# Patient Record
Sex: Female | Born: 1940 | Race: White | Hispanic: No | Marital: Single | Smoking: Former smoker
Health system: Southern US, Community
[De-identification: ages and names within clinical notes are randomized; demographics above are authoritative.]

## PROBLEM LIST (undated history)

## (undated) DIAGNOSIS — M1990 Unspecified osteoarthritis, unspecified site: Secondary | ICD-10-CM

## (undated) DIAGNOSIS — E039 Hypothyroidism, unspecified: Secondary | ICD-10-CM

## (undated) DIAGNOSIS — M8580 Other specified disorders of bone density and structure, unspecified site: Secondary | ICD-10-CM

## (undated) DIAGNOSIS — I341 Nonrheumatic mitral (valve) prolapse: Secondary | ICD-10-CM

## (undated) DIAGNOSIS — T7840XA Allergy, unspecified, initial encounter: Secondary | ICD-10-CM

## (undated) HISTORY — DX: Hypothyroidism, unspecified: E03.9

## (undated) HISTORY — DX: Nonrheumatic mitral (valve) prolapse: I34.1

## (undated) HISTORY — PX: ABDOMINAL HYSTERECTOMY: SHX81

## (undated) HISTORY — DX: Allergy, unspecified, initial encounter: T78.40XA

## (undated) HISTORY — DX: Unspecified osteoarthritis, unspecified site: M19.90

## (undated) HISTORY — PX: DILATION AND CURETTAGE OF UTERUS: SHX78

## (undated) HISTORY — PX: THYROIDECTOMY: SHX17

## (undated) HISTORY — PX: TONSILLECTOMY AND ADENOIDECTOMY: SUR1326

## (undated) HISTORY — PX: TUBAL LIGATION: SHX77

## (undated) HISTORY — PX: LEEP: SHX91

## (undated) HISTORY — DX: Other specified disorders of bone density and structure, unspecified site: M85.80

---

## 2001-01-09 ENCOUNTER — Encounter: Payer: Self-pay | Admitting: Internal Medicine

## 2001-01-09 LAB — CONVERTED CEMR LAB

## 2002-07-10 HISTORY — PX: KNEE CARTILAGE SURGERY: SHX688

## 2002-08-17 ENCOUNTER — Encounter: Payer: Self-pay | Admitting: Emergency Medicine

## 2002-08-17 ENCOUNTER — Emergency Department (HOSPITAL_COMMUNITY): Admission: EM | Admit: 2002-08-17 | Discharge: 2002-08-17 | Payer: Self-pay | Admitting: *Deleted

## 2003-01-07 ENCOUNTER — Encounter: Payer: Self-pay | Admitting: Internal Medicine

## 2004-04-18 ENCOUNTER — Ambulatory Visit: Payer: Self-pay | Admitting: Internal Medicine

## 2004-04-26 ENCOUNTER — Ambulatory Visit: Payer: Self-pay | Admitting: Internal Medicine

## 2004-04-26 ENCOUNTER — Other Ambulatory Visit: Admission: RE | Admit: 2004-04-26 | Discharge: 2004-04-26 | Payer: Self-pay | Admitting: Internal Medicine

## 2004-10-12 ENCOUNTER — Ambulatory Visit (HOSPITAL_COMMUNITY): Admission: RE | Admit: 2004-10-12 | Discharge: 2004-10-12 | Payer: Self-pay | Admitting: Internal Medicine

## 2005-06-07 ENCOUNTER — Ambulatory Visit: Payer: Self-pay | Admitting: Internal Medicine

## 2005-06-09 ENCOUNTER — Ambulatory Visit: Payer: Self-pay | Admitting: Internal Medicine

## 2005-07-21 ENCOUNTER — Ambulatory Visit: Payer: Self-pay | Admitting: Gastroenterology

## 2005-08-04 ENCOUNTER — Ambulatory Visit: Payer: Self-pay | Admitting: Internal Medicine

## 2006-03-23 ENCOUNTER — Ambulatory Visit (HOSPITAL_COMMUNITY): Admission: RE | Admit: 2006-03-23 | Discharge: 2006-03-23 | Payer: Self-pay | Admitting: Internal Medicine

## 2006-03-23 ENCOUNTER — Encounter: Payer: Self-pay | Admitting: Internal Medicine

## 2006-05-04 ENCOUNTER — Ambulatory Visit: Payer: Self-pay | Admitting: Internal Medicine

## 2006-06-15 ENCOUNTER — Ambulatory Visit: Payer: Self-pay | Admitting: Internal Medicine

## 2006-06-15 LAB — CONVERTED CEMR LAB
ALT: 16 units/L (ref 0–40)
AST: 20 units/L (ref 0–37)
Albumin: 3.8 g/dL (ref 3.5–5.2)
Alkaline Phosphatase: 54 units/L (ref 39–117)
BUN: 9 mg/dL (ref 6–23)
Basophils Absolute: 0 10*3/uL (ref 0.0–0.1)
Basophils Relative: 0.2 % (ref 0.0–1.0)
Bilirubin, Direct: 0.1 mg/dL (ref 0.0–0.3)
CO2: 29 meq/L (ref 19–32)
Calcium, Total (PTH): 9.3 mg/dL (ref 8.4–10.5)
Calcium: 9.4 mg/dL (ref 8.4–10.5)
Chloride: 108 meq/L (ref 96–112)
Cholesterol: 222 mg/dL (ref 0–200)
Creatinine, Ser: 0.9 mg/dL (ref 0.4–1.2)
Direct LDL: 133.2 mg/dL
Eosinophils Absolute: 0.2 10*3/uL (ref 0.0–0.6)
Eosinophils Relative: 5.6 % — ABNORMAL HIGH (ref 0.0–5.0)
GFR calc Af Amer: 81 mL/min
GFR calc non Af Amer: 67 mL/min
Glucose, Bld: 95 mg/dL (ref 70–99)
HCT: 40.9 % (ref 36.0–46.0)
HDL: 62.8 mg/dL (ref >39.0)
Hemoglobin: 14 g/dL (ref 12.0–15.0)
Lymphocytes Relative: 51 % — ABNORMAL HIGH (ref 12.0–46.0)
MCHC: 34.3 g/dL (ref 30.0–36.0)
MCV: 96.1 fL (ref 78.0–100.0)
Monocytes Absolute: 0.3 10*3/uL (ref 0.2–0.7)
Monocytes Relative: 8.2 % (ref 3.0–11.0)
Neutro Abs: 1.3 10*3/uL — ABNORMAL LOW (ref 1.4–7.7)
Neutrophils Relative %: 35 % — ABNORMAL LOW (ref 43.0–77.0)
PTH: 43.6 pg/mL (ref 14.0–72.0)
Platelets: 231 10*3/uL (ref 150–400)
Potassium: 4.1 meq/L (ref 3.5–5.1)
RBC: 4.26 M/uL (ref 3.87–5.11)
RDW: 12.9 % (ref 11.5–14.6)
Sodium: 143 meq/L (ref 135–145)
TSH: 6.24 microintl units/mL — ABNORMAL HIGH (ref 0.35–5.50)
Total Bilirubin: 1.2 mg/dL (ref 0.3–1.2)
Total CHOL/HDL Ratio: 3.5
Total Protein: 6.7 g/dL (ref 6.0–8.3)
Triglycerides: 79 mg/dL (ref 0–149)
VLDL: 16 mg/dL (ref 0–40)
Vit D, 1,25-Dihydroxy: 32 (ref 20–57)
WBC: 3.6 10*3/uL — ABNORMAL LOW (ref 4.5–10.5)

## 2006-06-22 ENCOUNTER — Ambulatory Visit: Payer: Self-pay | Admitting: Internal Medicine

## 2006-08-13 ENCOUNTER — Encounter: Payer: Self-pay | Admitting: Internal Medicine

## 2006-08-13 DIAGNOSIS — M949 Disorder of cartilage, unspecified: Secondary | ICD-10-CM

## 2006-08-13 DIAGNOSIS — E039 Hypothyroidism, unspecified: Secondary | ICD-10-CM | POA: Insufficient documentation

## 2006-08-13 DIAGNOSIS — M899 Disorder of bone, unspecified: Secondary | ICD-10-CM | POA: Insufficient documentation

## 2006-08-24 ENCOUNTER — Ambulatory Visit: Payer: Self-pay | Admitting: Internal Medicine

## 2006-08-24 LAB — CONVERTED CEMR LAB
Anti Nuclear Antibody(ANA): NEGATIVE
Basophils Absolute: 0 10*3/uL (ref 0.0–0.1)
Basophils Relative: 0.3 % (ref 0.0–1.0)
Eosinophils Absolute: 0.2 10*3/uL (ref 0.0–0.6)
Eosinophils Relative: 4 % (ref 0.0–5.0)
HCT: 40.4 % (ref 36.0–46.0)
Hemoglobin: 13.9 g/dL (ref 12.0–15.0)
Lymphocytes Relative: 44.1 % (ref 12.0–46.0)
MCHC: 34.5 g/dL (ref 30.0–36.0)
MCV: 96.3 fL (ref 78.0–100.0)
Monocytes Absolute: 0.4 10*3/uL (ref 0.2–0.7)
Monocytes Relative: 9.2 % (ref 3.0–11.0)
Neutro Abs: 1.7 10*3/uL (ref 1.4–7.7)
Neutrophils Relative %: 42.4 % — ABNORMAL LOW (ref 43.0–77.0)
Platelets: 254 10*3/uL (ref 150–400)
RBC: 4.19 M/uL (ref 3.87–5.11)
RDW: 12.4 % (ref 11.5–14.6)
TSH: 0.05 microintl units/mL — ABNORMAL LOW (ref 0.35–5.50)
Vit D, 1,25-Dihydroxy: 26 (ref 20–57)
Vitamin B-12: 440 pg/mL (ref 211–911)
WBC: 4 10*3/uL — ABNORMAL LOW (ref 4.5–10.5)

## 2006-08-31 ENCOUNTER — Ambulatory Visit: Payer: Self-pay | Admitting: Internal Medicine

## 2006-08-31 DIAGNOSIS — D709 Neutropenia, unspecified: Secondary | ICD-10-CM | POA: Insufficient documentation

## 2006-08-31 DIAGNOSIS — R209 Unspecified disturbances of skin sensation: Secondary | ICD-10-CM | POA: Insufficient documentation

## 2006-09-03 ENCOUNTER — Telehealth: Payer: Self-pay | Admitting: Internal Medicine

## 2006-09-11 ENCOUNTER — Encounter: Payer: Self-pay | Admitting: Internal Medicine

## 2006-11-30 ENCOUNTER — Ambulatory Visit: Payer: Self-pay | Admitting: Internal Medicine

## 2006-12-03 LAB — CONVERTED CEMR LAB: TSH: 0.05 microintl units/mL — ABNORMAL LOW (ref 0.35–5.50)

## 2006-12-05 ENCOUNTER — Telehealth: Payer: Self-pay | Admitting: *Deleted

## 2007-01-23 ENCOUNTER — Encounter: Payer: Self-pay | Admitting: Internal Medicine

## 2007-02-08 ENCOUNTER — Ambulatory Visit: Payer: Self-pay | Admitting: Internal Medicine

## 2007-02-08 LAB — CONVERTED CEMR LAB
Bilirubin Urine: NEGATIVE
Glucose, Urine, Semiquant: NEGATIVE
Ketones, urine, test strip: NEGATIVE
Nitrite: NEGATIVE
Protein, U semiquant: NEGATIVE
Specific Gravity, Urine: 1.015
Urobilinogen, UA: 0.2
WBC Urine, dipstick: NEGATIVE
pH: 7

## 2007-02-10 LAB — CONVERTED CEMR LAB
ALT: 23 units/L (ref 0–35)
AST: 19 units/L (ref 0–37)
Albumin: 3.9 g/dL (ref 3.5–5.2)
Alkaline Phosphatase: 63 units/L (ref 39–117)
BUN: 12 mg/dL (ref 6–23)
Basophils Absolute: 0 10*3/uL (ref 0.0–0.1)
Basophils Relative: 0.9 % (ref 0.0–1.0)
Bilirubin, Direct: 0.2 mg/dL (ref 0.0–0.3)
CO2: 31 meq/L (ref 19–32)
Calcium: 9.6 mg/dL (ref 8.4–10.5)
Chloride: 106 meq/L (ref 96–112)
Cholesterol: 207 mg/dL (ref 0–200)
Creatinine, Ser: 0.9 mg/dL (ref 0.4–1.2)
Direct LDL: 126.8 mg/dL
Eosinophils Absolute: 0.2 10*3/uL (ref 0.0–0.6)
Eosinophils Relative: 5 % (ref 0.0–5.0)
GFR calc Af Amer: 81 mL/min
GFR calc non Af Amer: 67 mL/min
Glucose, Bld: 94 mg/dL (ref 70–99)
HCT: 42 % (ref 36.0–46.0)
HDL: 61.2 mg/dL (ref >39.0)
Hemoglobin: 14 g/dL (ref 12.0–15.0)
Lymphocytes Relative: 48.5 % — ABNORMAL HIGH (ref 12.0–46.0)
MCHC: 33.2 g/dL (ref 30.0–36.0)
MCV: 95.6 fL (ref 78.0–100.0)
Monocytes Absolute: 0.4 10*3/uL (ref 0.2–0.7)
Monocytes Relative: 8.6 % (ref 3.0–11.0)
Neutro Abs: 1.6 10*3/uL (ref 1.4–7.7)
Neutrophils Relative %: 37 % — ABNORMAL LOW (ref 43.0–77.0)
Platelets: 236 10*3/uL (ref 150–400)
Potassium: 4.4 meq/L (ref 3.5–5.1)
RBC: 4.39 M/uL (ref 3.87–5.11)
RDW: 12.5 % (ref 11.5–14.6)
Sodium: 143 meq/L (ref 135–145)
TSH: 0.09 microintl units/mL — ABNORMAL LOW (ref 0.35–5.50)
Total Bilirubin: 0.9 mg/dL (ref 0.3–1.2)
Total CHOL/HDL Ratio: 3.4
Total Protein: 6.3 g/dL (ref 6.0–8.3)
Triglycerides: 51 mg/dL (ref 0–149)
VLDL: 10 mg/dL (ref 0–40)
WBC: 4.2 10*3/uL — ABNORMAL LOW (ref 4.5–10.5)

## 2007-02-15 ENCOUNTER — Ambulatory Visit: Payer: Self-pay | Admitting: Internal Medicine

## 2007-02-15 DIAGNOSIS — J309 Allergic rhinitis, unspecified: Secondary | ICD-10-CM | POA: Insufficient documentation

## 2007-02-27 ENCOUNTER — Ambulatory Visit: Payer: Self-pay | Admitting: Internal Medicine

## 2007-05-15 ENCOUNTER — Ambulatory Visit: Payer: Self-pay | Admitting: Internal Medicine

## 2007-05-22 LAB — CONVERTED CEMR LAB: TSH: 0.09 microintl units/mL — ABNORMAL LOW (ref 0.35–5.50)

## 2007-06-07 ENCOUNTER — Ambulatory Visit: Payer: Self-pay | Admitting: Internal Medicine

## 2007-06-07 DIAGNOSIS — L255 Unspecified contact dermatitis due to plants, except food: Secondary | ICD-10-CM | POA: Insufficient documentation

## 2007-06-07 DIAGNOSIS — N814 Uterovaginal prolapse, unspecified: Secondary | ICD-10-CM | POA: Insufficient documentation

## 2007-07-10 ENCOUNTER — Other Ambulatory Visit: Admission: RE | Admit: 2007-07-10 | Discharge: 2007-07-10 | Payer: Self-pay | Admitting: Obstetrics & Gynecology

## 2007-08-16 ENCOUNTER — Ambulatory Visit: Payer: Self-pay | Admitting: Internal Medicine

## 2007-08-26 ENCOUNTER — Telehealth: Payer: Self-pay | Admitting: *Deleted

## 2007-08-26 LAB — CONVERTED CEMR LAB: TSH: 0.1 microintl units/mL — ABNORMAL LOW (ref 0.35–5.50)

## 2007-09-02 ENCOUNTER — Encounter: Payer: Self-pay | Admitting: Obstetrics & Gynecology

## 2007-09-02 ENCOUNTER — Encounter: Payer: Self-pay | Admitting: Internal Medicine

## 2007-09-02 ENCOUNTER — Ambulatory Visit (HOSPITAL_COMMUNITY): Admission: RE | Admit: 2007-09-02 | Discharge: 2007-09-03 | Payer: Self-pay | Admitting: Obstetrics & Gynecology

## 2007-09-03 ENCOUNTER — Encounter: Payer: Self-pay | Admitting: Internal Medicine

## 2007-09-30 ENCOUNTER — Encounter: Payer: Self-pay | Admitting: Internal Medicine

## 2007-10-08 ENCOUNTER — Encounter: Payer: Self-pay | Admitting: Internal Medicine

## 2007-11-06 ENCOUNTER — Ambulatory Visit: Payer: Self-pay | Admitting: Internal Medicine

## 2007-11-12 LAB — CONVERTED CEMR LAB: TSH: 0.1 microintl units/mL — ABNORMAL LOW (ref 0.35–5.50)

## 2008-02-13 ENCOUNTER — Ambulatory Visit: Payer: Self-pay | Admitting: Internal Medicine

## 2008-02-20 LAB — CONVERTED CEMR LAB: TSH: 1.34 microintl units/mL (ref 0.35–5.50)

## 2008-04-29 ENCOUNTER — Ambulatory Visit: Payer: Self-pay | Admitting: Internal Medicine

## 2008-04-29 DIAGNOSIS — Z8679 Personal history of other diseases of the circulatory system: Secondary | ICD-10-CM | POA: Insufficient documentation

## 2008-04-29 DIAGNOSIS — R9431 Abnormal electrocardiogram [ECG] [EKG]: Secondary | ICD-10-CM | POA: Insufficient documentation

## 2008-05-25 ENCOUNTER — Telehealth: Payer: Self-pay | Admitting: *Deleted

## 2009-02-11 ENCOUNTER — Telehealth: Payer: Self-pay | Admitting: *Deleted

## 2009-05-05 ENCOUNTER — Telehealth: Payer: Self-pay | Admitting: *Deleted

## 2009-06-04 ENCOUNTER — Ambulatory Visit: Payer: Self-pay | Admitting: Internal Medicine

## 2009-06-04 LAB — CONVERTED CEMR LAB
ALT: 17 units/L (ref 0–35)
AST: 18 units/L (ref 0–37)
Albumin: 3.8 g/dL (ref 3.5–5.2)
Alkaline Phosphatase: 52 units/L (ref 39–117)
BUN: 14 mg/dL (ref 6–23)
Basophils Absolute: 0 10*3/uL (ref 0.0–0.1)
Basophils Relative: 1.1 % (ref 0.0–3.0)
Bilirubin Urine: NEGATIVE
Bilirubin, Direct: 0.2 mg/dL (ref 0.0–0.3)
Blood in Urine, dipstick: NEGATIVE
CO2: 32 meq/L (ref 19–32)
Calcium: 9.2 mg/dL (ref 8.4–10.5)
Chloride: 110 meq/L (ref 96–112)
Cholesterol: 192 mg/dL (ref 0–200)
Creatinine, Ser: 0.8 mg/dL (ref 0.4–1.2)
Eosinophils Absolute: 0.2 10*3/uL (ref 0.0–0.7)
Eosinophils Relative: 4.5 % (ref 0.0–5.0)
GFR calc non Af Amer: 72.93 mL/min (ref >60)
Glucose, Bld: 90 mg/dL (ref 70–99)
Glucose, Urine, Semiquant: NEGATIVE
HCT: 40.4 % (ref 36.0–46.0)
HDL: 54.6 mg/dL (ref >39.00)
Hemoglobin: 13.9 g/dL (ref 12.0–15.0)
Ketones, urine, test strip: NEGATIVE
LDL Cholesterol: 129 mg/dL — ABNORMAL HIGH (ref 0–99)
Lymphocytes Relative: 52.2 % — ABNORMAL HIGH (ref 12.0–46.0)
Lymphs Abs: 2 10*3/uL (ref 0.7–4.0)
MCHC: 34.3 g/dL (ref 30.0–36.0)
MCV: 96.7 fL (ref 78.0–100.0)
Monocytes Absolute: 0.3 10*3/uL (ref 0.1–1.0)
Monocytes Relative: 7.8 % (ref 3.0–12.0)
Neutro Abs: 1.3 10*3/uL — ABNORMAL LOW (ref 1.4–7.7)
Neutrophils Relative %: 34.4 % — ABNORMAL LOW (ref 43.0–77.0)
Nitrite: NEGATIVE
Platelets: 207 10*3/uL (ref 150.0–400.0)
Potassium: 5.1 meq/L (ref 3.5–5.1)
RBC: 4.18 M/uL (ref 3.87–5.11)
RDW: 13.5 % (ref 11.5–14.6)
Sodium: 147 meq/L — ABNORMAL HIGH (ref 135–145)
Specific Gravity, Urine: 1.015
TSH: 1.93 microintl units/mL (ref 0.35–5.50)
Total Bilirubin: 0.7 mg/dL (ref 0.3–1.2)
Total CHOL/HDL Ratio: 4
Total Protein: 6.2 g/dL (ref 6.0–8.3)
Triglycerides: 44 mg/dL (ref 0.0–149.0)
Urobilinogen, UA: 0.2
VLDL: 8.8 mg/dL (ref 0.0–40.0)
WBC: 3.8 10*3/uL — ABNORMAL LOW (ref 4.5–10.5)
pH: 8.5

## 2009-06-11 ENCOUNTER — Ambulatory Visit: Payer: Self-pay | Admitting: Internal Medicine

## 2010-01-30 ENCOUNTER — Encounter: Payer: Self-pay | Admitting: Internal Medicine

## 2010-02-01 ENCOUNTER — Telehealth: Payer: Self-pay | Admitting: *Deleted

## 2010-02-02 ENCOUNTER — Telehealth: Payer: Self-pay | Admitting: *Deleted

## 2010-02-04 ENCOUNTER — Ambulatory Visit (HOSPITAL_COMMUNITY)
Admission: RE | Admit: 2010-02-04 | Discharge: 2010-02-04 | Payer: Self-pay | Source: Home / Self Care | Attending: Internal Medicine | Admitting: Internal Medicine

## 2010-02-06 LAB — CONVERTED CEMR LAB: Pap Smear: NORMAL

## 2010-02-08 NOTE — Assessment & Plan Note (Signed)
Summary: cpx/ssc   Vital Signs:  Patient profile:   70 year old female Menstrual status:  hysterectomy Height:      62 inches Weight:      172 pounds BMI:     31.57 Pulse rate:   72 / minute BP sitting:   120 / 80  (right arm) Cuff size:   regular  Vitals Entered By: Romualdo Bolk, CMA (AAMA) (June 11, 2009 2:26 PM)  Nutrition Counseling: Patient's BMI is greater than 25 and therefore counseled on weight management options. CC: CPX   History of Present Illness: Denise Moran comes in today   for preventive visit  Since last visit  here  there have been no major changes in health status  .    working   and exercising as possible .   Taking Synthroid daily without problem .No new specialist .UTD on preventive measures .  Bone health ON actonel no problems LAst dexa  stable. CV no signs has mild MVP   last ekg pre hysterectomy   Preventive Care Screening  Prior Values:    Pap Smear:  Normal (04/29/2008)    Mammogram:  normal (07/13/2006)    Colonoscopy:  normal (08/04/2005)    Bone Density:  -2.0 (04/10/2003)    Last Tetanus Booster:  Tdap (01/10/2003)    Last Pneumovax:  Pneumovax (04/29/2008)    Dexa Interp:  -2.0 (04/10/2003)   Preventive Screening-Counseling & Management  Alcohol-Tobacco     Alcohol drinks/day: <1     Alcohol type: wine     Smoking Status: quit     Year Quit: 24 years ago  Caffeine-Diet-Exercise     Caffeine use/day: 2-3     Does Patient Exercise: yes     Type of exercise: walking     Times/week: 3  Hep-HIV-STD-Contraception     Dental Visit-last 6 months yes     Sun Exposure-Excessive: no  Safety-Violence-Falls     Seat Belt Use: 100     Firearms in the Home: no firearms in the home     Smoke Detectors: yes     Fall Risk: ocasas   Current Medications (verified): 1)  Synthroid 75 Mcg Tabs (Levothyroxine Sodium) .Marland Kitchen.. 1 By Mouth Once Daily 2)  Calcium 600 Mg  Tabs (Calcium) 3)  Multivitamins   Caps (Multiple Vitamin) 4)  B-100    Tabs (Vitamins-Lipotropics) 5)  Motrin Ib 200 Mg  Tabs (Ibuprofen) .... Take 1 Tablet By Mouth Four Times A Day 6)  Omega-3 350 Mg  Caps (Omega-3 Fatty Acids) 7)  Vitamin D 400 Unit  Tabs (Cholecalciferol) 8)  Actonel 150 Mg  Tabs (Risedronate Sodium) .Marland Kitchen.. 1 By Mouth Q Month 9)  Lidex 0.05 %  Crea (Fluocinonide) .... Apply To Poison Ivy Two Times A Day As Directed 10)  Vitamin C Cr 1000 Mg Cr-Tabs (Ascorbic Acid)  Allergies (verified): 1)  ! Morphine 2)  ! Darvocet 3)  ! Percocet  Past History:  Past medical, surgical, family and social histories (including risk factors) reviewed, and no changes noted (except as noted below).  Past Medical History: Reviewed history from 04/29/2008 and no changes required. Hypothyroidism Osteopenia MVP,  hx abnormal EKG with eval by cardiology , last echo 2004  allergies Childbirth x3  G3P3 Allergic rhinitis  Arthritis  Knee   Past Surgical History: Reviewed history from 04/29/2008 and no changes required. Tonsillectomy, adenoidectomy Thyroidectomy sub total and then Irradiation.  l knee meniscus surgery 7/04 LEEP/D&C BTL  Hysterectomy  2009   Past History:  Care Management: Gynecology: Dr. Hyacinth Meeker- in the past Gastroenterology: [facility]  Family History: Reviewed history from 04/29/2008 and no changes required. Family History Thyroid diseaseGm aunt GPs Father Multiple cvas and CRF  12/15/1981Mom pacer    died July  2009  98 Sibs well except thyroid.  parents long lived    Social History: Reviewed history from 04/29/2008 and no changes required. 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  Caffeine use/day:  2-3 Dental Care w/in 6 mos.:  yes Sun Exposure-Excessive:  no Fall Risk:  ocasas   Review of Systems       12 system review neg cv pulm , vision hearing ,  Physical Exam General Appearance: well developed, well  nourished, no acute distress Eyes: conjunctiva and lids normal, PERRLA, EOMI,  WNL Ears, Nose, Mouth, Throat: TM clear, nares clear, oral exam WNL Neck: supple, no lymphadenopathy, no thyromegaly, no JVD Respiratory: clear to auscultation and percussion, respiratory effort normal Cardiovascular: regular rate and rhythm, S1-S2, no murmur, rub or gallop, no bruits, peripheral pulses normal and symmetric, no cyanosis, clubbing, edema or varicosities no click hear today  Chest: no scars, masses, tenderness; no asymmetry, skin changes, nipple discharge   Gastrointestinal: soft, non-tender; no hepatosplenomegaly, masses; active bowel sounds all quadrants,  Lymphatic: no cervical, axillary or inguinal adenopathy Musculoskeletal: gait normal, muscle tone and strength WNL, no joint swelling, effusions, discoloration, crepitus  Skin: clear, good turgor, color WNL, no rashes, lesions, or ulcerations Neurologic: normal mental status, normal reflexes, normal strength, sensation, and motion Psychiatric: alert; oriented to person, place and time Other Exam:  see labs   wbc 3.8  otherwise nl     Impression & Recommendations:  Problem # 1:  PREVENTIVE HEALTH CARE (ICD-V70.0) Discussed nutrition,exercise,diet,healthy weight, vitamin D and calcium.  healthy  Problem # 2:  HYPOTHYROIDISM (ICD-244.9) no change in dose Her updated medication list for this problem includes:    Synthroid 75 Mcg Tabs (Levothyroxine sodium) .Marland Kitchen... 1 by mouth once daily  Labs Reviewed: TSH: 1.93 (06/04/2009)    Chol: 192 (06/04/2009)   HDL: 54.60 (06/04/2009)   LDL: 129 (06/04/2009)   TG: 44.0 (06/04/2009)  Problem # 3:  MITRAL VALVE PROLAPSE, HX OF (ICD-V12.50) Assessment: Comment Only  Problem # 4:  NEUTROPENIA NOS (ICD-288.00) no change no symptom of rheum or blood disease.   follow yearly  Problem # 5:  OSTEOPENIA (ICD-733.90) Assessment: Comment Only  Her updated medication list for this problem includes:     Calcium 600 Mg Tabs (Calcium)    Vitamin D 400 Unit Tabs (Cholecalciferol)    Actonel 150 Mg Tabs (Risedronate sodium) .Marland Kitchen... 1 by mouth q month  Complete Medication List: 1)  Synthroid 75 Mcg Tabs (Levothyroxine sodium) .Marland Kitchen.. 1 by mouth once daily 2)  Calcium 600 Mg Tabs (Calcium) 3)  Multivitamins Caps (Multiple vitamin) 4)  B-100 Tabs (Vitamins-lipotropics) 5)  Motrin Ib 200 Mg Tabs (Ibuprofen) .... Take 1 tablet by mouth four times a day 6)  Omega-3 350 Mg Caps (Omega-3 fatty acids) 7)  Vitamin D 400 Unit Tabs (Cholecalciferol) 8)  Actonel 150 Mg Tabs (Risedronate sodium) .Marland Kitchen.. 1 by mouth q month 9)  Lidex 0.05 % Crea (Fluocinonide) .... Apply to poison ivy two times a day as directed 10)  Vitamin C Cr 1000 Mg Cr-tabs (Ascorbic acid)  Preventive Care Screening  Prior Values:    Pap Smear:  Normal (04/29/2008)    Mammogram:  normal (07/13/2006)    Colonoscopy:  normal (08/04/2005)    Bone Density:  -2.0 (04/10/2003)    Last Tetanus Booster:  Tdap (01/10/2003)    Last Pneumovax:  Pneumovax (04/29/2008)    Dexa Interp:  -2.0 (04/10/2003)

## 2010-02-08 NOTE — Progress Notes (Signed)
Summary: refill  Phone Note Call from Patient Call back at Home Phone [phone]   Caller: [facility] mail Reason for Call: Refill Medication Summary of Call: Refill thyroid meds. Send to [facility]. Going out of town next week. Initial call taken by: Warnell Forester,  February 11, 2009 11:20 AM  Follow-up for Phone Call        Rx sent electronically- pt needs a rov before next refill. Follow-up by: Romualdo Bolk, CMA (AAMA),  February 11, 2009 11:49 AM  Additional Follow-up for Phone Call Additional follow up Details #1::        Tried to call pt about scheduling a follow up appt but the number we have is no longer in service. Additional Follow-up by: Romualdo Bolk, CMA (AAMA),  February 12, 2009 12:51 PM    Prescriptions: SYNTHROID 75 MCG TABS (LEVOTHYROXINE SODIUM) 1 by mouth once daily Brand medically necessary #90 x 0   Entered by:   Romualdo Bolk, CMA (AAMA)   Authorized by:   Madelin Headings MD   Signed by:   Romualdo Bolk, CMA (AAMA) on 02/11/2009   Method used:   Electronically to        [facility]* (mail-order)             ,          Ph: [phone]       Fax: [phone]   RxID:   [id number]

## 2010-02-08 NOTE — Progress Notes (Signed)
Summary: refill  Phone Note From Pharmacy   Caller: [facility] Reason for Call: Needs renewal Details for Reason: Synthroid Initial call taken by: Romualdo Bolk, CMA Duncan Dull),  May 05, 2009 12:15 PM  Follow-up for Phone Call        LMTOCB- Pt needs to schedule Follow-up by: Romualdo Bolk, CMA Duncan Dull),  May 05, 2009 4:56 PM  Additional Follow-up for Phone Call Additional follow up Details #1::        Pt needs a cpx on friday- Can we do 2 rov for this in am or pm? Additional Follow-up by: Romualdo Bolk, CMA (AAMA),  May 07, 2009 9:35 AM    Additional Follow-up for Phone Call Additional follow up Details #2::    yes  but Not  on June 10th pm. Follow-up by: Madelin Headings MD,  May 07, 2009 1:28 PM  Additional Follow-up for Phone Call Additional follow up Details #3:: Details for Additional Follow-up Action Taken: LMTOCB Romualdo Bolk, CMA (AAMA)  May 10, 2009 9:10 AM Pt aware and cpx appt made. Rx sent to [facility] Additional Follow-up by: Romualdo Bolk, CMA (AAMA),  May 14, 2009 11:32 AM  Prescriptions: SYNTHROID 75 MCG TABS (LEVOTHYROXINE SODIUM) 1 by mouth once daily Brand medically necessary #90 x 0   Entered by:   Romualdo Bolk, CMA (AAMA)   Authorized by:   Madelin Headings MD   Signed by:   Romualdo Bolk, CMA (AAMA) on 05/14/2009   Method used:   Electronically to        [facility]* (mail-order)             ,          Ph: [phone]       Fax: [phone]   RxID:   [id number]

## 2010-02-10 NOTE — Progress Notes (Signed)
Summary: NEW RX  Phone Note Refill Request   Refills Requested: Medication #1:  SYNTHROID 75 MCG TABS 1 by mouth once daily [BMN]  Medication #2:  ACTONEL 150 MG  TABS 1 by mouth q month PT WOULD LIKE 90 DAY SUPPLY CALL INTO [facility] [phone]   Initial call taken by: Heron Sabins,  February 01, 2010 10:21 AM  Follow-up for Phone Call        tell patient this was sent Follow-up by: Madelin Headings MD,  February 01, 2010 6:36 PM  Additional Follow-up for Phone Call Additional follow up Details #1::        Pt aware of this. Additional Follow-up by: Romualdo Bolk, CMA (AAMA),  February 02, 2010 9:11 AM    Prescriptions: ACTONEL 150 MG  TABS (RISEDRONATE SODIUM) 1 by mouth q month  #3 x 3   Entered and Authorized by:   Madelin Headings MD   Signed by:   Madelin Headings MD on 02/01/2010   Method used:   Electronically to        [facility]* (retail)       [address]       [address] [employer]       [place], [place]  [address]       Ph: [phone]       Fax: [phone]   RxID:   [id number] SYNTHROID 75 MCG TABS (LEVOTHYROXINE SODIUM) 1 by mouth once daily Brand medically necessary #90 Tablet x 2   Entered and Authorized by:   Madelin Headings MD   Signed by:   Madelin Headings MD on 02/01/2010   Method used:   Electronically to        [facility]* (retail)       [address]       [address] [employer]       [place], [place]  [address]       Ph: [phone]       Fax: [phone]   RxID:   [id number]

## 2010-02-10 NOTE — Progress Notes (Signed)
Summary: Change actonel to fosamax  Phone Note From Pharmacy   Caller: [facility] Reason for Call: Patient requests substitution Summary of Call: Actonel copay is $173. Pt is requesting alternative to actonel. Can they change her to fosamax? Initial call taken by: Romualdo Bolk, CMA Duncan Dull),  February 02, 2010 3:08 PM  Follow-up for Phone Call        ask the patient    if she wants to change to weekly fosamax as per pharmacy Follow-up by: Madelin Headings MD,  February 03, 2010 1:13 PM  Additional Follow-up for Phone Call Additional follow up Details #1::        Spoke to pt and she does want to change to weekly fosamax Additional Follow-up by: Romualdo Bolk, CMA Duncan Dull),  February 04, 2010 9:43 AM    Additional Follow-up for Phone Call Additional follow up Details #2::    fosamax 70 1 by mouth q week disp 12 refill x 3  Follow-up by: Madelin Headings MD,  February 04, 2010 5:05 PM  Additional Follow-up for Phone Call Additional follow up Details #3:: Details for Additional Follow-up Action Taken: rx sent to pharmacy Additional Follow-up by: Romualdo Bolk, CMA (AAMA),  February 04, 2010 5:08 PM  New/Updated Medications: FOSAMAX 70 MG TABS (ALENDRONATE SODIUM) 1 by mouth weekly Prescriptions: FOSAMAX 70 MG TABS (ALENDRONATE SODIUM) 1 by mouth weekly  #12 x 3   Entered by:   Romualdo Bolk, CMA (AAMA)   Authorized by:   Madelin Headings MD   Signed by:   Romualdo Bolk, CMA (AAMA) on 02/04/2010   Method used:   Electronically to        [facility]* (retail)       [address]       [address] [employer]       [place], [place]  [address]       Ph: [phone]       Fax: [phone]   RxID:   [id number]

## 2010-02-14 LAB — HM DIABETES EYE EXAM: HM Diabetic Eye Exam: NORMAL

## 2010-05-24 NOTE — Op Note (Signed)
NAMEMERITA, HAWKS               ACCOUNT NO.:  [id number]   MEDICAL RECORD NO.:  [id number]          PATIENT TYPE:  OIB   LOCATION:  9303                          FACILITY:  [facility]   PHYSICIAN:  M. Leda Quail, MD  DATE OF BIRTH:  02-02-1940   DATE OF PROCEDURE:  09/02/2007  DATE OF DISCHARGE:                               OPERATIVE REPORT   PREOPERATIVE DIAGNOSES:  73. A 70 year old G3, P-2-0-3-1 single white female with third-degree      uterine prolapse.  2. Second-degree cystocele.  3. Hypothyroidism.   POSTOPERATIVE DIAGNOSES:  15. A 70 year old G3, P-2-0-3-1 single white female with third-degree      uterine prolapse.  2. Second-degree cystocele.  3. Hypothyroidism.   PROCEDURE:  Total vaginal hysterectomy with bilateral salpingo-  oophorectomy.   SURGEON:  M. Leda Quail, MD   ASSISTANT:  Edwena Felty. Romine, MD   ANESTHESIA:  Spinal.   FINDINGS:  Small uterus, normal-appearing ovaries and tubes.  The  patient is status post tubal ligation with third-degree prolapse of the  uterus and secondary cystocele.   SPECIMENS:  Uterus, cervix, tubes, and ovaries sent to pathology.   ESTIMATED BLOOD LOSS:  Less than 50 mL.   GRAFT FLUIDS:  2000 mL of LR.   URINE OUTPUT:  200 mL of clear urine, drained with Foley catheter at the  end of the procedure and an in-and-out catheterization was done the  beginning of the procedure with clear urine present then as well.   COMPLICATIONS:  None.   INDICATIONS:  Ms. Chervenak is a very nice 70 year old white female who  has a history of symptomatic uterine prolapse.  She came to me in  referral from Dr. Fabian Sharp.  We discussed her findings on exam and  treatment options, which would include possible pelvic physical therapy,  pessary use or surgical treatment with a hysterectomy and uterosacral  ligament plication.  The patient is sexually active.  She is not  interested in having a pessary, and after considering options, she  decided to proceed with hysterectomy.  Risks and benefits were explained  to the patient and are documented in her office chart.  The patient  presents today for procedure.   PROCEDURE:  The patient was taken to the operating room.  She was  positioned on the table for spinal anesthesia.  This was administered by  Dr. Jean Rosenthal without difficulty.  She was then placed in supine position.  The level of anesthesia was checked to make sure that it is adequate.  When she was comfortable, legs were positioned in the dorsal lithotomy  position.   Perineum, inner thighs, and vagina were prepped and draped in normal  sterile fashion.  An in-and-out Foley catheterization was performed to  drain the bladder of all urine.  Attention was turned toward the vagina.  A short heavy weighted speculum was placed in the vagina.  The cervix  was grasped with straight Jacobson tenaculum.  1% lidocaine mixed 1:1  with epinephrine (1:100,000 units) was then instilled circumferentially  around the cervix.  The posterior cul-de-sac was entered sharply.  Figure-of-eight  suture of #0 Vicryl was placed to connect the posterior  vaginal mucosa and posterior peritoneum.  The patient was placed in a  tiny bit of Trendelenburg.  The cervix was then incised anteriorly with  the knife.  Then, the  plane where the pubovesical cervical fascia was  present was identified and the operator's index finger was able to push  into this plane very easily.  The reflection of the anterior peritoneum  was easily visualized, was grasped with smooth pickups and incised sharp  with Metzenbaum scissors.  This plane was correct plane and the opening  was up to the anterior cul-de-sac at this point.  A curves Deaver  retractor was placed in this opening without difficulty.  Then, the  uterosacral ligaments were clamped bilaterally, transected, and stitched  with a Heaney suture of #0 Vicryl.  At this point, the cardinal  ligaments were  serially clamped, transected and suture ligated with #0  Vicryl bilaterally.  Next, uterine artery was clamped, transected, and  doubly suture ligated with #0 Vicryl.  The broad was ligament serially  clamped and transected and suture ligated to the level of the utero-  ovarian pedicles.  The pedicle was small enough but I felt like to get  across with the curved Heaney retractors.  The fundus of the uterus was  grasped with a single-tooth tenaculum and retracted bringing the  pedicles closer.  The utero-ovarian pedicles at this point were complete  traversed and clamped with curved Heaney clamps.  The pedicles were  transected and the uterus and cervix were delivered through the vagina.  This was handed off to be sent to pathology.  The utero-ovarian pedicles  were sutured with #0 Vicryl first and then a stitch tie of #0 Vicryl.  These were held long.  The ovaries were visualized and small  bilaterally, but they could be retracted into the vagina.  Therefore,  the IP ligaments bilaterally were clamped with a curved Heaney clamp.  The pedicle was transected and the ovaries and tubes were removed  bilaterally.  These pedicles were then doubly stitched first with a free  tie of #0 Vicryl and second with a stitch tie of #0 Vicryl.  At this  point, the pedicles were inspected and these were hemostatic.  There was  no bleeding noted.  An open lap tape was placed, pushed the bowel  anteriorly and again no bleeding was noted.  The sutures for the IP  ligament at this point were cut short and the hemostats attached were  handed off.  At this point, the cuff was run starting at the 2 o'clock  position.  Anterior vaginal mucosa and anterior peritoneum were  incorporated in the stitch.  The stitch was secured around the cervix  incorporating the posterior peritoneum, posterior vaginal mucosa and  then ended at 10 o'clock position.  The uterosacral stitch of 2-0 Vicryl  was then brought to the  posterior vaginal mucosa.  The medial third of  the right uterosacral ligament was incorporated in the stitch.  The  posterior vaginal mucosa was reefed across and the medial third of the  right uterosacral ligament was incorporated in the stitch.  The suture  was then brought out through the posterior vaginal mucosa and was tied  tightly.  Next, the cuff was closed with 4 figure-of-eight sutures of #0  Vicryl.  The lap tape was removed from the vaginal opening before the  cuff was closed.  At this point, the procedure  was ended.  All  instruments were removed from the vagina.  The vaginal tissue was  irrigated and no bleeding was noted.  A Foley catheter was inserted in  place to straight drain.   The patient's legs were removed from the dorsal lithotomy position, and  she was taken out of Trendelenburg positioning.  The Betadine was  cleaned from the skin.  Sponge, lap, needle, and instruments counts were  correct x2.  She tolerated the procedure well.  She was taken to  recovery room in stable condition.      Lum Keas, MD  Electronically Signed     MSM/MEDQ  D:  09/02/2007  T:  09/03/2007  Job:  [id number]   cc:   Neta Mends. Fabian [place], MD  [address] [place], [place] [address]

## 2010-05-24 NOTE — Discharge Summary (Signed)
Denise, Moran               ACCOUNT NO.:  [id number]   MEDICAL RECORD NO.:  [id number]          PATIENT TYPE:  OIB   LOCATION:  9303                          [facility]ACILITY:  [facility]   PHYSICIAN:  M. Leda Quail, MD  DATE OF BIRTH:  [birth year]   DATE OF ADMISSION:  09/02/2007  DATE OF DISCHARGE:  09/03/2007                               DISCHARGE SUMMARY   ADMISSION DIAGNOSES:  78. A 70 year old G3, P2-0-3-1 single white female with a history of      symptomatic uterine prolapse.  2. Hypothyroidism.  3. Desirous of ovary removal.   DISCHARGE DIAGNOSES:  40. A 70 year old G3, P2-0-3-1 single white female with a history of      symptomatic uterine prolapse.  2. Hypothyroidism.  3. Desirous of ovary removal.   PROCEDURE:  Transvaginal hysterectomy with bilateral salpingo-  oophorectomy.   HISTORY OF PRESENT ILLNESS:  Written H and P is in the chart.  In brief,  this is a 70 year old white female who has symptomatic uterine prolapse  with a third degree uterine prolapse without any Valsalva maneuver and a  second-degree cystocele who actually has worsening prolapse with a  Valsalva.  She has this completely continent of urine without any other  urinary issues.  Because of this, we discussed options for treatment.  She is not interested in a pessary at this time.  She is sexually active  and desires to maintain that part of her life.  She was decided for  definitive management via hysterectomy.  Because the size of the uterus  and the prolapse, I felt vaginal approach was completely appropriate.  Risks and benefits have been described to the patient.  This was  documented in her office chart.   HOSPITAL COURSE:  The patient was admitted to same-day surgery.  She was  taken to the operating room.  Transvaginal hysterectomy was performed  without difficulty.  She had about 50 mL of blood loss.  She had made  adequate urine output during the surgery in the recovery room.  From  there  she was transferred to the third floor where she remained for the  rest of her hospitalization.  In the day and the evening after her  surgery, she did have significant issues with positional nausea.  The  patient did have a spinal anesthesia for pain management during her  surgery.  I felt that most likely this was related to the spinal  anesthesia.  She only also received Toradol.  She did declined any  treatment with a PCA for pain management and she really did not have any  issues with pain management.  She did have adequate urine output  postoperatively and she did have a stable vital signs.  The patient was  not able to really tolerate liquids in the evening after surgery.  She  did have difficulty standing as well because of the positional nausea.  Ultimately in the middle of the night, the nausea seemed to wear off.  She was able to sit at the side of the bed for an extended period time  and then  ambulate without difficulty.  In the morning of postoperative  day #1, she has stable vital signs, her pulses remained in the 50s, and  her blood pressure was normal.  She had again adequate urine output, had  been afebrile.  Postop labs in the morning showed a white count of 6.7,  hemoglobin of 11.3, and platelet count 184,000.  Electrolytes were  normal with a glucose 146, BUN and creatinine 6.7 and sodium and  potassium 136 and 3.7.  She had excellent bowel sounds.  She had minimal  bleeding on her pad.  Her Foley catheter was discontinued.  She was  advanced to regular diet.  She was up to ambulate.  If she is able to  void later this more without difficulty.  She will be discharged home in  stable condition.  Discharge instructions have been provided in the  written and verbal form.  She has a postop appointment in 1 week.  Postop pain management with Motrin and Percocet prescriptions were given  preoperatively in my office.      Lum Keas, MD  Electronically  Signed     MSM/MEDQ  D:  09/03/2007  T:  09/03/2007  Job:  [id number]

## 2010-09-02 ENCOUNTER — Encounter: Payer: Self-pay | Admitting: Internal Medicine

## 2010-11-21 ENCOUNTER — Other Ambulatory Visit: Payer: Self-pay | Admitting: Internal Medicine

## 2011-02-08 ENCOUNTER — Other Ambulatory Visit (INDEPENDENT_AMBULATORY_CARE_PROVIDER_SITE_OTHER): Payer: 59

## 2011-02-08 DIAGNOSIS — Z0000 Encounter for general adult medical examination without abnormal findings: Secondary | ICD-10-CM

## 2011-02-08 LAB — POCT URINALYSIS DIPSTICK
Bilirubin, UA: NEGATIVE
Blood, UA: NEGATIVE
Glucose, UA: NEGATIVE
Ketones, UA: NEGATIVE
Leukocytes, UA: NEGATIVE
Nitrite, UA: NEGATIVE
Protein, UA: NEGATIVE
Spec Grav, UA: 1.015
Urobilinogen, UA: 0.2
pH, UA: 8.5

## 2011-02-08 LAB — HEPATIC FUNCTION PANEL
ALT: 18 U/L (ref 0–35)
AST: 21 U/L (ref 0–37)
Albumin: 3.9 g/dL (ref 3.5–5.2)
Alkaline Phosphatase: 43 U/L (ref 39–117)
Bilirubin, Direct: 0 mg/dL (ref 0.0–0.3)
Total Bilirubin: 0.7 mg/dL (ref 0.3–1.2)
Total Protein: 6.7 g/dL (ref 6.0–8.3)

## 2011-02-08 LAB — CBC WITH DIFFERENTIAL/PLATELET
Basophils Absolute: 0 10*3/uL (ref 0.0–0.1)
Basophils Relative: 0.8 % (ref 0.0–3.0)
Eosinophils Absolute: 0.2 10*3/uL (ref 0.0–0.7)
Eosinophils Relative: 4.5 % (ref 0.0–5.0)
HCT: 41.2 % (ref 36.0–46.0)
Hemoglobin: 13.9 g/dL (ref 12.0–15.0)
Lymphocytes Relative: 45.5 % (ref 12.0–46.0)
Lymphs Abs: 2 10*3/uL (ref 0.7–4.0)
MCHC: 33.7 g/dL (ref 30.0–36.0)
MCV: 98.1 fl (ref 78.0–100.0)
Monocytes Absolute: 0.4 10*3/uL (ref 0.1–1.0)
Monocytes Relative: 9 % (ref 3.0–12.0)
Neutro Abs: 1.7 10*3/uL (ref 1.4–7.7)
Neutrophils Relative %: 40.2 % — ABNORMAL LOW (ref 43.0–77.0)
Platelets: 199 10*3/uL (ref 150.0–400.0)
RBC: 4.2 Mil/uL (ref 3.87–5.11)
RDW: 14.4 % (ref 11.5–14.6)
WBC: 4.3 10*3/uL — ABNORMAL LOW (ref 4.5–10.5)

## 2011-02-08 LAB — BASIC METABOLIC PANEL
BUN: 15 mg/dL (ref 6–23)
CO2: 28 mEq/L (ref 19–32)
Calcium: 9.1 mg/dL (ref 8.4–10.5)
Chloride: 110 mEq/L (ref 96–112)
Creatinine, Ser: 1 mg/dL (ref 0.4–1.2)
GFR: 62.09 mL/min (ref >60.00)
Glucose, Bld: 87 mg/dL (ref 70–99)
Potassium: 4.7 mEq/L (ref 3.5–5.1)
Sodium: 145 mEq/L (ref 135–145)

## 2011-02-08 LAB — LIPID PANEL
Cholesterol: 207 mg/dL — ABNORMAL HIGH (ref 0–200)
HDL: 61.9 mg/dL (ref >39.00)
Total CHOL/HDL Ratio: 3
Triglycerides: 55 mg/dL (ref 0.0–149.0)
VLDL: 11 mg/dL (ref 0.0–40.0)

## 2011-02-08 LAB — TSH: TSH: 1.96 u[IU]/mL (ref 0.35–5.50)

## 2011-02-09 LAB — LDL CHOLESTEROL, DIRECT: Direct LDL: 129.4 mg/dL

## 2011-02-15 ENCOUNTER — Ambulatory Visit (INDEPENDENT_AMBULATORY_CARE_PROVIDER_SITE_OTHER): Payer: 59 | Admitting: Internal Medicine

## 2011-02-15 ENCOUNTER — Encounter: Payer: Self-pay | Admitting: Internal Medicine

## 2011-02-15 VITALS — BP 150/80 | HR 66 | Ht 62.5 in | Wt 181.0 lb

## 2011-02-15 DIAGNOSIS — M899 Disorder of bone, unspecified: Secondary | ICD-10-CM

## 2011-02-15 DIAGNOSIS — R0989 Other specified symptoms and signs involving the circulatory and respiratory systems: Secondary | ICD-10-CM

## 2011-02-15 DIAGNOSIS — Z0000 Encounter for general adult medical examination without abnormal findings: Secondary | ICD-10-CM

## 2011-02-15 DIAGNOSIS — R0609 Other forms of dyspnea: Secondary | ICD-10-CM

## 2011-02-15 DIAGNOSIS — D709 Neutropenia, unspecified: Secondary | ICD-10-CM

## 2011-02-15 DIAGNOSIS — E039 Hypothyroidism, unspecified: Secondary | ICD-10-CM

## 2011-02-15 DIAGNOSIS — R0683 Snoring: Secondary | ICD-10-CM | POA: Insufficient documentation

## 2011-02-15 MED ORDER — FLUTICASONE PROPIONATE 50 MCG/ACT NA SUSP: 2.0000 | Freq: Every day | NASAL | Status: DC

## 2011-02-15 NOTE — Patient Instructions (Signed)
Same dose of Synthroid.  Get DEXA when convenient .  Unless severe osteoporosis consider trying off the fosamax for couple years and repeat dexa in 2 years or so to monitor  Continue exercise and vitamin D.

## 2011-02-15 NOTE — Assessment & Plan Note (Signed)
No change 

## 2011-02-15 NOTE — Assessment & Plan Note (Signed)
Get dexa consider  Stopping med for a few years if stable. Continuing vit d and calcium intake

## 2011-02-15 NOTE — Progress Notes (Signed)
Subjective:    Patient ID: Denise Moran, female    DOB: 1940/06/23, 71 y.o.   MRN: [id number]  HPI Patient comes in today for Preventive Health Care visit  And med management Thyroid no change in meds  Taking regluarly [facility]: taking a bisphosphonate for over 10 year actonel and then fosamax no hx of fracture no change Vertigo rare now  Has dec hearing remotely after scuba diving about 10 years ago no progression. UTD on HCM   Review of Systems ROS:  GEN/ HEENT: No fever, significant weight changes sweats headaches vision problems hearing changes,  Some dec in hearing from   Barotrauma ? Scuba diving in past  CV/ PULM; No chest pain shortness of breath cough, syncope,edema  change in exercise tolerance. GI /GU: No adominal pain, vomiting, change in bowel habits. No blood in the stool. No significant GU symptoms. SKIN/HEME: ,no acute skin rashes suspicious lesions or bleeding. No lymphadenopathy, nodules, masses.  NEURO/ PSYCH:  No neurologic signs such as weakness numbness. No depression anxiety. IMM/ Allergy: No unusual infections.  Allergy .   Some snoring.  Hx deviated  septum  Hx fracture   Chronic sinus drainage.  REST of 12 system review negative except as per HPI Outpatient Encounter Prescriptions as of 02/15/2011  Medication Sig Dispense Refill  . alendronate (FOSAMAX) 70 MG tablet Take 70 mg by mouth every 7 (seven) days. Take with a full glass of water on an empty stomach.      . Ascorbic Acid (VITAMIN C) 1000 MG tablet Take 1,000 mg by mouth daily.      . calcium carbonate (OS-CAL) 600 MG TABS Take 600 mg by mouth 2 (two) times daily with a meal.      . fish oil-omega-3 fatty acids 1000 MG capsule Take 2 g by mouth daily.      Marland Kitchen ibuprofen (ADVIL,MOTRIN) 200 MG tablet Take 200 mg by mouth every 6 (six) hours as needed.      . MULTIPLE VITAMIN PO Take by mouth.      . SYNTHROID 75 MCG tablet TAKE 1 TABLET BY MOUTH DAILY  90 tablet  2  . thiamine (VITAMIN B-1) 100 MG  tablet Take 100 mg by mouth daily.      . vitamin D, CHOLECALCIFEROL, 400 UNITS tablet Take 400 Units by mouth daily.       Past Medical History  Diagnosis Date  . Hypothyroidism   . Osteopenia   . MVP (mitral valve prolapse)     hx abnormal ekg with eval by cardiology last echo 2004  . Allergy   . Arthritis     knee    History   Social History  . Marital Status: Single    Spouse Name: N/A    Number of Children: N/A  . Years of Education: N/A   Occupational History  . Not on file.   Social History Main Topics  . Smoking status: Former Games developer  . Smokeless tobacco: Not on file  . Alcohol Use: Yes     social  . Drug Use: No  . Sexually Active: Not on file   Other Topics Concern  . 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 Tonsillectomy and adenoidectomy   . Thyroidectomy     sub total and then irradiation  . Knee cartilage surgery 7/04  .  Leep   . Dilation and curettage of uterus   . Abdominal hysterectomy   . Tubal ligation     Family History  Problem Relation Age of Onset  . Thyroid disease      gm, aunt and gp's  . Other Father     multiple cva's and crf  . Other Mother     pacer    Allergies  Allergen Reactions  . Morphine   . Oxycodone-Acetaminophen   . Propoxyphene N-Acetaminophen     Current Outpatient Prescriptions on File Prior to Visit  Medication Sig Dispense Refill  . SYNTHROID 75 MCG tablet TAKE 1 TABLET BY MOUTH DAILY  90 tablet  2    BP 150/80  Pulse 66  Ht 5' 2.5" (1.588 m)  Wt 181 lb (82.101 kg)  BMI 32.58 kg/m2        Objective:   Physical Exam Physical Exam: Vital signs reviewed [id number] is a well-developed well-nourished alert cooperative  white female who appears her stated age in no acute distress.  HEENT: normocephalic atraumatic , Eyes: PERRL EOM's full, conjunctiva clear, Nares:  paten,t no deformity discharge or tenderness., Ears: no deformity EAC's clear TMs with normal landmarks. Mouth: clear OP, no lesions, edema.  Moist mucous membranes. Dentition in adequate repair. NECK: supple without masses, thyromegaly or bruits. CHEST/PULM:  Clear to auscultation and percussion breath sounds equal no wheeze , rales or rhonchi. No chest wall deformities or tenderness. Breast: normal by inspection . No dimpling, discharge, masses, tenderness or discharge .  CV: PMI is nondisplaced, S1 S2 no gallops, murmurs, rubs. Peripheral pulses are full without delay.No JVD .  ABDOMEN: Bowel sounds normal nontender  No guard or rebound, no hepato splenomegal no CVA tenderness.  No hernia. Extremtities:  No clubbing cyanosis or edema, no acute joint swelling or redness no focal atrophy  No acute effusion NEURO:  Oriented x3, cranial nerves 3-12 appear to be intact, no obvious focal weakness,gait within normal limits no abnormal reflexes  SKIN: No acute rashes normal turgor, color, no bruising or petechiae. PSYCH: Oriented, good eye contact, no obvious depression anxiety, cognition and judgment appear normal. LN: no cervical axillary inguinal adenopathy    Lab Results  Component Value Date   WBC 4.3* 02/08/2011   HGB 13.9 02/08/2011   HCT 41.2 02/08/2011   PLT 199.0 02/08/2011   GLUCOSE 87 02/08/2011   CHOL 207* 02/08/2011   TRIG 55.0 02/08/2011   HDL 61.90 02/08/2011   LDLDIRECT 129.4 02/08/2011   LDLCALC 129* 06/04/2009   ALT 18 02/08/2011   AST 21 02/08/2011   NA 145 02/08/2011   K 4.7 02/08/2011   CL 110 02/08/2011   CREATININE 1.0 02/08/2011   BUN 15 02/08/2011   CO2 28 02/08/2011   TSH 1.96 02/08/2011           Assessment & Plan:    Preventive Health Care UTD  Counseled regarding healthy nutrition, exercise, sleep, injury prevention, calcium vit d and healthy weight .  Snoring and pn drainage  No help with  otc antihistamines  Consider  Trying flonase    Thyroid  Continue  replacement rx    LIPID LSI  Mild leukopenia porb incidental finding stable   Osteopenia  No fam hx  Of hip fx and no personal fx and on bisph for over 10 yrs consider stopping for a few years if  dexa stable .Marland Kitchen Written order for dexa given to patient.

## 2011-08-04 ENCOUNTER — Other Ambulatory Visit: Payer: Self-pay | Admitting: Internal Medicine

## 2011-10-02 ENCOUNTER — Encounter: Payer: Self-pay | Admitting: Internal Medicine

## 2012-01-01 ENCOUNTER — Other Ambulatory Visit: Payer: Self-pay | Admitting: Internal Medicine

## 2012-02-12 ENCOUNTER — Other Ambulatory Visit: Payer: Self-pay | Admitting: Internal Medicine

## 2012-05-15 ENCOUNTER — Other Ambulatory Visit (INDEPENDENT_AMBULATORY_CARE_PROVIDER_SITE_OTHER): Payer: 59

## 2012-05-15 DIAGNOSIS — Z0000 Encounter for general adult medical examination without abnormal findings: Secondary | ICD-10-CM

## 2012-05-15 LAB — CBC WITH DIFFERENTIAL/PLATELET
Basophils Absolute: 0 10*3/uL (ref 0.0–0.1)
HCT: 42.2 % (ref 36.0–46.0)
Hemoglobin: 14.4 g/dL (ref 12.0–15.0)
Lymphs Abs: 1.8 10*3/uL (ref 0.7–4.0)
MCV: 95.2 fl (ref 78.0–100.0)
Monocytes Relative: 9.1 % (ref 3.0–12.0)
Neutro Abs: 2.2 10*3/uL (ref 1.4–7.7)
RDW: 14.3 % (ref 11.5–14.6)

## 2012-05-15 LAB — BASIC METABOLIC PANEL
CO2: 28 mEq/L (ref 19–32)
Calcium: 9 mg/dL (ref 8.4–10.5)
Creatinine, Ser: 1 mg/dL (ref 0.4–1.2)
Glucose, Bld: 95 mg/dL (ref 70–99)

## 2012-05-15 LAB — HEPATIC FUNCTION PANEL
ALT: 16 U/L (ref 0–35)
AST: 18 U/L (ref 0–37)
Alkaline Phosphatase: 48 U/L (ref 39–117)
Bilirubin, Direct: 0.1 mg/dL (ref 0.0–0.3)
Total Bilirubin: 0.9 mg/dL (ref 0.3–1.2)

## 2012-05-15 LAB — LIPID PANEL: Triglycerides: 65 mg/dL (ref 0.0–149.0)

## 2012-05-22 ENCOUNTER — Ambulatory Visit (INDEPENDENT_AMBULATORY_CARE_PROVIDER_SITE_OTHER): Payer: 59 | Admitting: Internal Medicine

## 2012-05-22 ENCOUNTER — Encounter: Payer: Self-pay | Admitting: Internal Medicine

## 2012-05-22 VITALS — BP 128/80 | HR 60 | Temp 98.2°F | Ht 62.25 in | Wt 188.0 lb

## 2012-05-22 DIAGNOSIS — M899 Disorder of bone, unspecified: Secondary | ICD-10-CM

## 2012-05-22 DIAGNOSIS — M949 Disorder of cartilage, unspecified: Secondary | ICD-10-CM

## 2012-05-22 DIAGNOSIS — E039 Hypothyroidism, unspecified: Secondary | ICD-10-CM

## 2012-05-22 DIAGNOSIS — Z0000 Encounter for general adult medical examination without abnormal findings: Secondary | ICD-10-CM

## 2012-05-22 MED ORDER — SYNTHROID 75 MCG PO TABS: ORAL_TABLET | ORAL | Status: DC

## 2012-05-22 NOTE — Progress Notes (Signed)
Chief Complaint  Patient presents with  . Annual Exam    HPI: Patient comes in today for Preventive Health Care visit  Since her last visit no major changes in her health [place] pinky finger. It did swell and still tender there and fourth area but good range of motion and function.  Continuing thyroid replacement. No cardiovascular pulmonary symptoms or new. Is overdue for mammogram perhaps a colonoscopy. Continues to work full-time without concern active as she can but has gained some weight.  ROS:  GEN/ HEENT: No fever, significant weight changes sweats headaches vision problems hearing changes, CV/ PULM; No chest pain shortness of breath cough, syncope,edema  change in exercise tolerance. GI /GU: No adominal pain, vomiting, change in bowel habits. No blood in the stool. No significant GU symptoms. SKIN/HEME: ,no acute skin rashes suspicious lesions or bleeding. No lymphadenopathy, nodules, masses.  NEURO/ PSYCH:  No neurologic signs such as weakness numbness. No depression anxiety. IMM/ Allergy: No unusual infections.  Allergy .   REST of 12 system review negative except as per HPI   Past Medical History  Diagnosis Date  . Hypothyroidism   . Osteopenia   . MVP (mitral valve prolapse)     hx abnormal ekg with eval by cardiology last echo 2004  . Allergy   . Arthritis     knee    Family History  Problem Relation Age of Onset  . Thyroid disease      gm, aunt and gp's  . Other Father     multiple cva's and crf  . Other Mother     pacer    History   Social History  . Marital Status: Single    Spouse Name: N/A    Number of Children: N/A  . Years of Education: N/A   Social History Main Topics  . Smoking status: Former Smoker -- 24 years  . Smokeless tobacco: None  . Alcohol Use: Yes     Comment: social  . Drug Use: No  . Sexually Active: None   Other Topics Concern  . 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 Ascorbic Acid (VITAMIN C) 1000 MG tablet Take 1,000 mg by mouth daily.      . calcium carbonate (OS-CAL) 600 MG TABS Take 600 mg by mouth 2 (two) times daily with a meal.      . fish oil-omega-3 fatty acids 1000 MG capsule Take 2 g by mouth daily.      Marland Kitchen ibuprofen (ADVIL,MOTRIN) 200 MG tablet Take 200 mg by mouth every 6 (six) hours as needed.      . MULTIPLE VITAMIN PO Take by mouth.      . SYNTHROID 75 MCG tablet TAKE 1 TABLET BY MOUTH DAILY  90 tablet  3  . thiamine (VITAMIN B-1) 100 MG tablet Take 100 mg by mouth daily.      . vitamin D, CHOLECALCIFEROL, 400 UNITS tablet Take 400 Units by mouth daily.      . [DISCONTINUED] SYNTHROID 75 MCG tablet TAKE 1 TABLET BY MOUTH DAILY  90 tablet  0  . [DISCONTINUED] alendronate (FOSAMAX) 70 MG tablet Take  70 mg by mouth every 7 (seven) days. Take with a full glass of water on an empty stomach.      . [DISCONTINUED] fluticasone (FLONASE) 50 MCG/ACT nasal spray Place 2 sprays into the nose daily.  16 g  6   No facility-administered encounter medications on file as of 05/22/2012.   Past Surgical History  Procedure Laterality Date  . Tonsillectomy and adenoidectomy    . Thyroidectomy      sub total and then irradiation  . Knee cartilage surgery  7/04  . Leep    . Dilation and curettage of uterus    . Abdominal hysterectomy    . Tubal ligation       EXAM:  BP 128/80  Pulse 60  Temp(Src) 98.2 F (36.8 C) (Oral)  Ht 5' 2.25" (1.581 m)  Wt 188 lb (85.276 kg)  BMI 34.12 kg/m2  SpO2 98%  Body mass index is 34.12 kg/(m^2).  Physical Exam: Vital signs reviewed ZOX:WRUE is a well-developed well-nourished alert cooperative   female who appears her stated age in no acute distress.  HEENT: normocephalic atraumatic ,  Eyes: PERRL EOM's full, conjunctiva clear, Nares: paten,t no deformity discharge or tenderness., Ears: no deformity EAC's clear TMs with normal landmarks. Mouth: clear OP, no lesions, edema.  Moist mucous membranes. Dentition in adequate repair. NECK: supple without masses, thyromegaly or bruits. Well-healed thyroidectomy scar CHEST/PULM:  Clear to auscultation and percussion breath sounds equal no wheeze , rales or rhonchi. No chest wall deformities or tenderness. Breasts no nodules or discharge axilla peers clear CV: PMI is nondisplaced, S1 S2 no gallops, murmurs, rubs. Peripheral pulses are full without delay.No JVD .  ABDOMEN: Bowel sounds normal nontender  No guard or rebound, no hepato splenomegal no CVA tenderness.  No hernia. Extremtities:  No clubbing cyanosis or edema, no acute joint swelling or redness no focal atrophy well-healed scar knee NEURO:  Oriented x3, cranial nerves 3-12 appear to be intact, no obvious focal weakness,gait within normal limits no abnormal reflexes or asymmetrical SKIN: No acute rashes normal turgor, color, no bruising or petechiae. A dry patch on the left thigh no alarming findings no redness PSYCH: Oriented, good eye contact, no obvious depression anxiety, cognition and judgment appear normal. LN: no cervical axillary inguinal adenopathy  Lab Results  Component Value Date   WBC 4.6 05/15/2012   HGB 14.4 05/15/2012   HCT 42.2 05/15/2012   PLT 209.0 05/15/2012   GLUCOSE 95 05/15/2012   CHOL 216* 05/15/2012   TRIG 65.0 05/15/2012   HDL 58.10 05/15/2012   LDLDIRECT 142.9 05/15/2012   LDLCALC 129* 06/04/2009   ALT 16 05/15/2012   AST 18 05/15/2012   NA 140 05/15/2012   K 4.3 05/15/2012   CL 107 05/15/2012   CREATININE 1.0 05/15/2012   BUN 15 05/15/2012   CO2 28 05/15/2012   TSH 1.73 05/15/2012    ASSESSMENT AND PLAN:  Discussed the following assessment and plan:  Visit for preventive health examination  HYPOTHYROIDISM - Continue replacement  OSTEOPENIA - Prescription given to  get DEXA scan when she gets her mammogram is off medication  Patient Care Team: Madelin Headings, MD as PCP - General Iva Boop, MD (Gastroenterology) Annamaria Boots, MD as Attending Physician (Gynecology) [facility] Ernestina Penna, MD (Ophthalmology) Patient Instructions  Continue lifestyle intervention healthy eating and exercise . Get a mammogram. Get your colonoscopy with Dr.   Leone Payor.  No change in thyroid replacement.  Eye exam  Bone  density .      Neta Mends. Nyelah Emmerich M.D.  Health Maintenance  Topic Date Due  . Mammogram  02/05/2012  . Influenza Vaccine  09/09/2012  . Tetanus/tdap  01/09/2013  . Colonoscopy  10/10/2015  . Pneumococcal Polysaccharide Vaccine Age 7 And Over  Completed  . Zostavax  Completed    }

## 2012-05-22 NOTE — Patient Instructions (Addendum)
Continue lifestyle intervention healthy eating and exercise . Get a mammogram. Get your colonoscopy with Dr.   Leone Payor.  No change in thyroid replacement.  Eye exam  Bone density .

## 2013-06-27 ENCOUNTER — Telehealth: Payer: Self-pay | Admitting: Internal Medicine

## 2013-06-27 NOTE — Telephone Encounter (Signed)
[facility] - [place], [place] - [address] Is requesting 90 day re-fill on SYNTHROID 75 MCG tablet

## 2013-06-30 MED ORDER — SYNTHROID 75 MCG PO TABS
ORAL_TABLET | ORAL | Status: DC
Start: 2013-06-30 — End: 2013-07-01

## 2013-06-30 NOTE — Telephone Encounter (Signed)
Pt is scheduled for 09/10/13 at 11:15. Can you send in re-fills to hold her over till the appointment please?

## 2013-06-30 NOTE — Telephone Encounter (Signed)
Sent to the pharmacy by e-scribe for #30.  It has been more than a year since she was last seen and had lab work.  Please schedule the pt for Medicare Wellness.  Pt should come fasting. Thanks!

## 2013-07-01 MED ORDER — SYNTHROID 75 MCG PO TABS: ORAL_TABLET | ORAL | Status: DC

## 2013-07-01 NOTE — Telephone Encounter (Signed)
Sent to the pharmacy by e-scribe. 

## 2013-09-10 ENCOUNTER — Encounter: Payer: Self-pay | Admitting: Internal Medicine

## 2013-09-10 ENCOUNTER — Ambulatory Visit (INDEPENDENT_AMBULATORY_CARE_PROVIDER_SITE_OTHER): Payer: 59 | Admitting: Internal Medicine

## 2013-09-10 VITALS — BP 140/80 | Temp 98.5°F | Ht 62.5 in | Wt 186.0 lb

## 2013-09-10 DIAGNOSIS — E039 Hypothyroidism, unspecified: Secondary | ICD-10-CM

## 2013-09-10 DIAGNOSIS — M949 Disorder of cartilage, unspecified: Secondary | ICD-10-CM

## 2013-09-10 DIAGNOSIS — Z0000 Encounter for general adult medical examination without abnormal findings: Secondary | ICD-10-CM

## 2013-09-10 DIAGNOSIS — M899 Disorder of bone, unspecified: Secondary | ICD-10-CM

## 2013-09-10 DIAGNOSIS — Z23 Encounter for immunization: Secondary | ICD-10-CM

## 2013-09-10 LAB — CBC WITH DIFFERENTIAL/PLATELET
BASOS PCT: 0.8 % (ref 0.0–3.0)
Basophils Absolute: 0 10*3/uL (ref 0.0–0.1)
EOS PCT: 2.8 % (ref 0.0–5.0)
Eosinophils Absolute: 0.2 10*3/uL (ref 0.0–0.7)
HEMATOCRIT: 42.9 % (ref 36.0–46.0)
HEMOGLOBIN: 14.4 g/dL (ref 12.0–15.0)
LYMPHS ABS: 2.4 10*3/uL (ref 0.7–4.0)
Lymphocytes Relative: 40.1 % (ref 12.0–46.0)
MCHC: 33.5 g/dL (ref 30.0–36.0)
MCV: 97.1 fl (ref 78.0–100.0)
MONOS PCT: 7.1 % (ref 3.0–12.0)
Monocytes Absolute: 0.4 10*3/uL (ref 0.1–1.0)
Neutro Abs: 2.9 10*3/uL (ref 1.4–7.7)
Neutrophils Relative %: 49.2 % (ref 43.0–77.0)
PLATELETS: 246 10*3/uL (ref 150.0–400.0)
RBC: 4.42 Mil/uL (ref 3.87–5.11)
RDW: 15.4 % (ref 11.5–15.5)
WBC: 6 10*3/uL (ref 4.0–10.5)

## 2013-09-10 LAB — BASIC METABOLIC PANEL
BUN: 13 mg/dL (ref 6–23)
CHLORIDE: 106 meq/L (ref 96–112)
CO2: 30 mEq/L (ref 19–32)
Calcium: 9.4 mg/dL (ref 8.4–10.5)
Creatinine, Ser: 1 mg/dL (ref 0.4–1.2)
GFR: 56.77 mL/min — AB (ref >60.00)
Glucose, Bld: 77 mg/dL (ref 70–99)
POTASSIUM: 4.8 meq/L (ref 3.5–5.1)
SODIUM: 142 meq/L (ref 135–145)

## 2013-09-10 LAB — HEPATIC FUNCTION PANEL
ALT: 17 U/L (ref 0–35)
AST: 23 U/L (ref 0–37)
Albumin: 4.1 g/dL (ref 3.5–5.2)
Alkaline Phosphatase: 47 U/L (ref 39–117)
BILIRUBIN TOTAL: 0.9 mg/dL (ref 0.2–1.2)
Bilirubin, Direct: 0 mg/dL (ref 0.0–0.3)
TOTAL PROTEIN: 7.1 g/dL (ref 6.0–8.3)

## 2013-09-10 LAB — LIPID PANEL
CHOLESTEROL: 255 mg/dL — AB (ref 0–200)
HDL: 60.6 mg/dL (ref >39.00)
LDL CALC: 177 mg/dL — AB (ref 0–99)
NonHDL: 194.4
TRIGLYCERIDES: 86 mg/dL (ref 0.0–149.0)
Total CHOL/HDL Ratio: 4
VLDL: 17.2 mg/dL (ref 0.0–40.0)

## 2013-09-10 LAB — TSH: TSH: 0.99 u[IU]/mL (ref 0.35–4.50)

## 2013-09-10 NOTE — Progress Notes (Signed)
Chief Complaint  Patient presents with  . Annual Exam  . Hypothyroidism    HPI: Patient comes in today for Preventive Health Care visit  No major changes in meds  Or health  BP 130 125  Range .    Was at dentist today.  Numbed on left side  Had ppd negative .   Thyroid no change med  taking daily Overdue fo colon mammo and ? dexa  Daughter in law is 32 weeks preg needs tdap Health Maintenance  Topic Date Due  . Mammogram  02/05/2012  . Influenza Vaccine  08/09/2013  . Colonoscopy  10/10/2015  . Tetanus/tdap  09/11/2023  . Pneumococcal Polysaccharide Vaccine Age 75 And Over  Completed  . Zostavax  Completed   Health Maintenance Review LIFESTYLE:  Exercise:  Not a huge . Amount  Walking yard work . But snorkels swims travels Tobacco/ETS: no Alcohol: hardly non except on vacation Sugar beverages:  Not  Much.   Sleep:  Like a log . Some snore not osa  Drug use: no Bone density:  2008 due  Colonoscopy: overdue ave risk  mammo overdue   ROS:  GEN/ HEENT: No fever, significant weight changes sweats headaches vision problems hearing changes, CV/ PULM; No chest pain shortness of breath cough, syncope,edema  change in exercise tolerance. GI /GU: No adominal pain, vomiting, change in bowel habits. No blood in the stool. No significant GU symptoms. SKIN/HEME: ,no acute skin rashes suspicious lesions or bleeding. No lymphadenopathy, nodules, masses.  NEURO/ PSYCH:  No neurologic signs such as weakness numbness. No depression anxiety. ocass delayed work finding IMM/ Allergy: No unusual infections.  Allergy .   REST of 12 system review negative except as per HPI   Past Medical History  Diagnosis Date  . Hypothyroidism   . Osteopenia   . MVP (mitral valve prolapse)     hx abnormal ekg with eval by cardiology last echo 2004  . Allergy   . Arthritis     knee    Family History  Problem Relation Age of Onset  . Thyroid disease      gm, aunt and gp's  . Other Father    multiple cva's and crf  . Other Mother     pacer    History   Social History  . Marital Status: Single    Spouse Name: N/A    Number of Children: N/A  . Years of Education: N/A   Social History Main Topics  . Smoking status: Former Smoker -- 24 years  . Smokeless tobacco: None  . Alcohol Use: Yes     Comment: social  . Drug Use: No  . Sexual Activity: None   Other Topics Concern  . 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    Outpatient Encounter Prescriptions as of 09/10/2013  Medication Sig  . Ascorbic Acid (VITAMIN C) 1000 MG tablet Take 1,000 mg by mouth daily.  . calcium carbonate (OS-CAL) 600 MG TABS Take 600 mg by mouth 2 (two) times daily with a meal.  . fish oil-omega-3 fatty acids 1000 MG capsule Take 2 g by mouth daily.  Marland Kitchen ibuprofen (ADVIL,MOTRIN) 200 MG tablet Take 200 mg by mouth every 6 (six) hours as needed.  . MULTIPLE VITAMIN  PO Take by mouth.  . SYNTHROID 75 MCG tablet TAKE 1 TABLET BY MOUTH DAILY  . thiamine (VITAMIN B-1) 100 MG tablet Take 100 mg by mouth daily.  . vitamin D, CHOLECALCIFEROL, 400 UNITS tablet Take 400 Units by mouth daily.    EXAM:  BP 140/80  Temp(Src) 98.5 F (36.9 C) (Oral)  Ht 5' 2.5" (1.588 m)  Wt 186 lb (84.369 kg)  BMI 33.46 kg/m2  Body mass index is 33.46 kg/(m^2).  Physical Exam: Vital signs reviewed ZOX:WRUE is a well-developed well-nourished alert cooperative    who appearsr stated age in no acute distress.  HEENT: normocephalic atraumatic , Eyes: PERRL EOM's full, conjunctiva clear, Nares: paten,t no deformity discharge or tenderness., Ears: no deformity EAC's clear TMs with normal landmarks. Mouth: clear OP, no lesions, edema.  Moist mucous membranes. Dentition in adequate repair. NECK: supple without masses, thyromegaly or bruits. CHEST/PULM:  Clear  to auscultation and percussion breath sounds equal no wheeze , rales or rhonchi. No chest wall deformities or tenderness. Breast: normal by inspection . No dimpling, discharge, masses, tenderness or discharge . CV: PMI is nondisplaced, S1 S2 no gallops, murmurs, rubs. Peripheral pulses are full without delay.No JVD .  ABDOMEN: Bowel sounds normal nontender  No guard or rebound, no hepato splenomegal no CVA tenderness.  No hernia. Extremtities:  No clubbing cyanosis or edema, no acute joint swelling or redness no focal atrophy NEURO:  Oriented x3, cranial nerves 3-12 appear to be intact, no obvious focal weakness,gait within normal limits no abnormal reflexes or asymmetrical SKIN: No acute rashes normal turgor, color, no bruising or petechiae. PSYCH: Oriented, good eye contact, no obvious depression anxiety, cognition and judgment appear normal. LN: no cervical axillary inguinal adenopathy  Lab Results  Component Value Date   WBC 4.6 05/15/2012   HGB 14.4 05/15/2012   HCT 42.2 05/15/2012   PLT 209.0 05/15/2012   GLUCOSE 95 05/15/2012   CHOL 216* 05/15/2012   TRIG 65.0 05/15/2012   HDL 58.10 05/15/2012   LDLDIRECT 142.9 05/15/2012   LDLCALC 129* 06/04/2009   ALT 16 05/15/2012   AST 18 05/15/2012   NA 140 05/15/2012   K 4.3 05/15/2012   CL 107 05/15/2012   CREATININE 1.0 05/15/2012   BUN 15 05/15/2012   CO2 28 05/15/2012   TSH 1.73 05/15/2012    ASSESSMENT AND PLAN:  Discussed the following assessment and plan:  Visit for preventive health examination - Plan: Basic metabolic panel, CBC with Differential, Hepatic function panel, Lipid panel, TSH  HYPOTHYROIDISM - Plan: Basic metabolic panel, CBC with Differential, Hepatic function panel, Lipid panel, TSH  Need for Tdap vaccination - Plan: Tdap vaccine greater than or equal to 7yo IM  Need for vaccination with 13-polyvalent pneumococcal conjugate vaccine - Plan: Pneumococcal conjugate vaccine 13-valent  OSTEOPENIA Due for lab monitoring  Assessment reviewed risk  benefit of screening procedures Order for dexa scan given to do with mammo pt to arrange cause of schedule limitations Patient Care Team: Madelin Headings, MD as PCP - General Iva Boop, MD (Gastroenterology) Annamaria Boots, MD as Attending Physician (Gynecology) [facility] ENT Ernestina Penna, MD (Ophthalmology) Patient Instructions  Monitor Bp to make sure in range . Continue lifestyle intervention healthy eating and exercise . Healthy lifestyle includes : At least 150 minutes of exercise weeks  , weight at healthy levels, which is usually   BMI 19-25. Avoid trans fats and processed foods;  Increase fresh fruits and veges to 5 servings per  day. And avoid sweet beverages including tea and juice. Mediterranean diet with olive oil and nuts have been noted to be heart and brain healthy . Avoid tobacco products . Limit  alcohol to  7 per week for women and 14 servings for men.  Get adequate sleep .  Will notify you  of labs when available. Get your colonoscopy  Or can see if you are a candidate for cologuard screening if insurance covers . Get mammogram. Wellness exam  And monitoring in 1 year or as needed     Neta Mends. Marquee Fuchs M.D.

## 2013-09-10 NOTE — Patient Instructions (Signed)
Monitor Bp to make sure in range . Continue lifestyle intervention healthy eating and exercise . Healthy lifestyle includes : At least 150 minutes of exercise weeks  , weight at healthy levels, which is usually   BMI 19-25. Avoid trans fats and processed foods;  Increase fresh fruits and veges to 5 servings per day. And avoid sweet beverages including tea and juice. Mediterranean diet with olive oil and nuts have been noted to be heart and brain healthy . Avoid tobacco products . Limit  alcohol to  7 per week for women and 14 servings for men.  Get adequate sleep .  Will notify you  of labs when available. Get your colonoscopy  Or can see if you are a candidate for cologuard screening if insurance covers . Get mammogram. Wellness exam  And monitoring in 1 year or as needed

## 2013-10-01 ENCOUNTER — Other Ambulatory Visit: Payer: Self-pay | Admitting: Internal Medicine

## 2015-01-19 DIAGNOSIS — H52203 Unspecified astigmatism, bilateral: Secondary | ICD-10-CM | POA: Diagnosis not present

## 2015-01-19 DIAGNOSIS — H5213 Myopia, bilateral: Secondary | ICD-10-CM | POA: Diagnosis not present

## 2015-02-04 MED FILL — VENTOLIN HFA 90 MCG INHALER: 108 (90 BAS | 30 days supply | Qty: 18 | Fill #0

## 2015-04-03 MED FILL — SYNTHROID 75 MCG TABLET: 75 | 90 days supply | Qty: 90 | Fill #2

## 2015-07-07 MED FILL — SYNTHROID 75 MCG TABLET: 75 | 90 days supply | Qty: 90 | Fill #0

## 2015-09-30 MED FILL — SYNTHROID 75 MCG TABLET: 75 | 90 days supply | Qty: 90 | Fill #1

## 2015-12-21 ENCOUNTER — Telehealth: Payer: Self-pay | Admitting: Internal Medicine

## 2015-12-21 NOTE — Telephone Encounter (Signed)
Patient would like to get her thyroid levels check and wants to know if she can have it done when she gets her lab work for her CPX.  Contact Info: [phone]

## 2015-12-22 NOTE — Telephone Encounter (Signed)
Left a message on identified voicemail informing the pt that thyroid will be checked when she comes in for lab work.

## 2016-01-03 MED FILL — SYNTHROID 75 MCG TABLET: 75 | 90 days supply | Qty: 90 | Fill #2

## 2016-01-03 MED FILL — VENTOLIN HFA 90 MCG INHALER: 108 (90 BAS | 30 days supply | Qty: 18 | Fill #1

## 2016-01-10 NOTE — Progress Notes (Signed)
Pre visit review using our clinic review tool, if applicable. No additional management support is needed unless otherwise documented below in the visit note.  Chief Complaint  Patient presents with  . Follow-up    Working on a residency visa.  Needs lab work.    HPI: Denise Moran 75 y.o.   sda appt    Last visit was over 2 years ago  She carries a diagnosis of hypothyroid hyperlipidemia generally well. Is working full-time 36 hours for [employer] and about 10 hours a week for [employer] [facility]. She is is applying for resonance [employer] these in the [place] where she plans to move. He requires infection screening lab work. To get a statement that she is in good health. [place] Plans on retiring in February 2018. Is due for mammogram and do for colonoscopy. Negative history of polyps family history no first-degree relatives with colon cancer grandparent had one.   To go to   [place] .   ROS: See pertinent positives and negatives per HPI.  Past Medical History:  Diagnosis Date  . Allergy   . Arthritis    knee  . Hypothyroidism   . MVP (mitral valve prolapse)    hx abnormal ekg with eval by cardiology last echo 2004  . Osteopenia     Family History  Problem Relation Age of Onset  . Thyroid disease      gm, aunt and gp's  . Other Father     multiple cva's and crf  . Other Mother     pacer    Social History   Social History  . Marital status: Single    Spouse name: N/A  . Number of children: N/A  . Years of education: N/A   Social History Main Topics  . Smoking status: Former Smoker    Years: 24.00  . Smokeless tobacco: None  . Alcohol use Yes     Comment: social  . Drug use: No  . Sexual activity: Not Asked   Other Topics Concern  . 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    Outpatient Medications Prior to Visit  Medication Sig Dispense Refill  . Ascorbic Acid (VITAMIN C) 1000 MG tablet Take 1,000 mg by mouth daily.    . calcium carbonate (OS-CAL) 600 MG TABS Take 600 mg by mouth 2 (two) times daily with a meal.    . fish oil-omega-3 fatty acids 1000 MG capsule Take 2 g by mouth daily.    Marland Kitchen ibuprofen (ADVIL,MOTRIN) 200 MG tablet Take 200 mg by mouth every 6 (six) hours as needed.    . MULTIPLE VITAMIN PO Take by mouth.    . SYNTHROID 75 MCG tablet TAKE 1 TABLET BY MOUTH DAILY 90 tablet 3  . thiamine (VITAMIN B-1) 100 MG tablet Take 100 mg by mouth daily.    . vitamin D, CHOLECALCIFEROL, 400 UNITS tablet Take 400 Units by mouth daily.     No facility-administered medications prior to visit.      EXAM:  BP (!) 144/80 (BP Location: Right Arm, Patient Position: Sitting, Cuff Size: Large)   Temp 98 F (36.7 C) (Oral)   Wt 194 lb 12.8 oz (88.4  kg)   BMI 35.06 kg/m   Body mass index is 35.06 kg/m.  GENERAL: vitals reviewed and listed above, alert, oriented, appears well hydrated and in no acute distress PSYCH: pleasant and cooperative, no obvious depression or anxiety  Wt Readings from Last 3 Encounters:  01/11/16 194 lb 12.8 oz (88.4 kg)  09/10/13 186 lb (84.4 kg)  05/22/12 188 lb (85.3 kg)   BP Readings from Last 3 Encounters:  01/11/16 (!) 144/80  09/10/13 140/80  05/22/12 128/80    ASSESSMENT AND PLAN:  Discussed the following assessment and plan:  Hypothyroidism, unspecified type  Screening for human immunodeficiency virus - low risk  check lab  fu with results  - Plan: HIV antibody (with reflex), RPR  prevenetive labs  - to return for exam and   certification   - Plan: Basic metabolic panel, CBC with Differential/Platelet, Hepatic function panel, Lipid panel, TSH  Medication management  Colon cancer screening - dsic risk assess check into coverage for cologuard she is candidate  .   Elevated BP without diagnosis of hypertension - rechecka t  cpx  pt aware   -Patient advised to return or notify health care team  if symptoms worsen ,persist or new concerns arise.  Patient Instructions  Lab pending cpx next week  Screening parameters and fy BP readings    Neta MendsWanda K. Panosh M.D.

## 2016-01-11 ENCOUNTER — Other Ambulatory Visit: Payer: 59

## 2016-01-11 ENCOUNTER — Encounter: Payer: Self-pay | Admitting: Internal Medicine

## 2016-01-11 ENCOUNTER — Ambulatory Visit (INDEPENDENT_AMBULATORY_CARE_PROVIDER_SITE_OTHER): Payer: 59 | Admitting: Internal Medicine

## 2016-01-11 VITALS — BP 144/80 | Temp 98.0°F | Wt 194.8 lb

## 2016-01-11 DIAGNOSIS — Z1211 Encounter for screening for malignant neoplasm of colon: Secondary | ICD-10-CM

## 2016-01-11 DIAGNOSIS — Z79899 Other long term (current) drug therapy: Secondary | ICD-10-CM

## 2016-01-11 DIAGNOSIS — Z0000 Encounter for general adult medical examination without abnormal findings: Secondary | ICD-10-CM

## 2016-01-11 DIAGNOSIS — E039 Hypothyroidism, unspecified: Secondary | ICD-10-CM

## 2016-01-11 DIAGNOSIS — Z114 Encounter for screening for human immunodeficiency virus [HIV]: Secondary | ICD-10-CM

## 2016-01-11 DIAGNOSIS — R030 Elevated blood-pressure reading, without diagnosis of hypertension: Secondary | ICD-10-CM

## 2016-01-11 LAB — CBC WITH DIFFERENTIAL/PLATELET
BASOS PCT: 0.7 % (ref 0.0–3.0)
Basophils Absolute: 0 10*3/uL (ref 0.0–0.1)
EOS PCT: 2 % (ref 0.0–5.0)
Eosinophils Absolute: 0.1 10*3/uL (ref 0.0–0.7)
HCT: 41 % (ref 36.0–46.0)
HEMOGLOBIN: 14.1 g/dL (ref 12.0–15.0)
LYMPHS PCT: 41.5 % (ref 12.0–46.0)
Lymphs Abs: 2.2 10*3/uL (ref 0.7–4.0)
MCHC: 34.4 g/dL (ref 30.0–36.0)
MCV: 94.4 fl (ref 78.0–100.0)
Monocytes Absolute: 0.4 10*3/uL (ref 0.1–1.0)
Monocytes Relative: 8.3 % (ref 3.0–12.0)
Neutro Abs: 2.6 10*3/uL (ref 1.4–7.7)
Neutrophils Relative %: 47.5 % (ref 43.0–77.0)
Platelets: 246 10*3/uL (ref 150.0–400.0)
RBC: 4.34 Mil/uL (ref 3.87–5.11)
RDW: 14.4 % (ref 11.5–15.5)
WBC: 5.4 10*3/uL (ref 4.0–10.5)

## 2016-01-11 LAB — LIPID PANEL
CHOLESTEROL: 216 mg/dL — AB (ref 0–200)
HDL: 63.3 mg/dL (ref >39.00)
LDL Cholesterol: 137 mg/dL — ABNORMAL HIGH (ref 0–99)
NonHDL: 152.77
TRIGLYCERIDES: 80 mg/dL (ref 0.0–149.0)
Total CHOL/HDL Ratio: 3
VLDL: 16 mg/dL (ref 0.0–40.0)

## 2016-01-11 LAB — BASIC METABOLIC PANEL
BUN: 12 mg/dL (ref 6–23)
CHLORIDE: 107 meq/L (ref 96–112)
CO2: 28 meq/L (ref 19–32)
Calcium: 9.3 mg/dL (ref 8.4–10.5)
Creatinine, Ser: 0.91 mg/dL (ref 0.40–1.20)
GFR: 64.34 mL/min (ref >60.00)
Glucose, Bld: 105 mg/dL — ABNORMAL HIGH (ref 70–99)
POTASSIUM: 4.6 meq/L (ref 3.5–5.1)
Sodium: 142 mEq/L (ref 135–145)

## 2016-01-11 LAB — HEPATIC FUNCTION PANEL
ALT: 17 U/L (ref 0–35)
AST: 18 U/L (ref 0–37)
Albumin: 4 g/dL (ref 3.5–5.2)
Alkaline Phosphatase: 65 U/L (ref 39–117)
Bilirubin, Direct: 0.1 mg/dL (ref 0.0–0.3)
TOTAL PROTEIN: 6.2 g/dL (ref 6.0–8.3)
Total Bilirubin: 0.7 mg/dL (ref 0.2–1.2)

## 2016-01-11 LAB — TSH: TSH: 1.84 u[IU]/mL (ref 0.35–4.50)

## 2016-01-11 NOTE — Patient Instructions (Signed)
Lab pending cpx next week  Screening parameters and fy BP readings

## 2016-01-12 LAB — RPR

## 2016-01-12 LAB — HIV ANTIBODY (ROUTINE TESTING W REFLEX): HIV 1&2 Ab, 4th Generation: NONREACTIVE

## 2016-01-18 NOTE — Progress Notes (Signed)
Pre visit review using our clinic review tool, if applicable. No additional management support is needed unless otherwise documented below in the visit note.  Chief Complaint  Patient presents with  . Annual Exam    HPI: Patient  Denise Moran  76 y.o. comes in today for Preventive Health Care visit   See last week  Still working   Hx of hld and thyroid  But healthy   Health Maintenance  Topic Date Due  . MAMMOGRAM  02/05/2012  . COLONOSCOPY  10/10/2015  . TETANUS/TDAP  09/11/2023  . INFLUENZA VACCINE  Addressed  . DEXA SCAN  Completed  . ZOSTAVAX  Completed  . PNA vac Low Risk Adult  Completed   Health Maintenance Review LIFESTYLE:  Exercise:  Not a lot now walkins some Tobacco/ETS:n Alcohol: social  Sugar beverages:n Sleep:good Drug use: no HH of 1 Work:ft over 40 houres  To retire in 2 months and move to [place]  See last note Due for mammo  Wants to get the cologuard    ROS:  GEN/ HEENT: No fever, significant weight changes sweats headaches vision problems hearing changes, CV/ PULM; No chest pain shortness of breath cough, syncope,edema  change in exercise tolerance. GI /GU: No adominal pain, vomiting, change in bowel habits. No blood in the stool. No significant GU symptoms. SKIN/HEME: ,no acute skin rashes suspicious lesions or bleeding. No lymphadenopathy, nodules, masses.  NEURO/ PSYCH:  No neurologic signs such as weakness numbness. No depression anxiety. IMM/ Allergy: No unusual infections.  Allergy .   REST of 12 system review negative except as per HPI   Past Medical History:  Diagnosis Date  . Allergy   . Arthritis    knee  . Hypothyroidism   . MVP (mitral valve prolapse)    hx abnormal ekg with eval by cardiology last echo 2004  . Osteopenia     Past Surgical History:  Procedure Laterality Date  . ABDOMINAL HYSTERECTOMY    . DILATION AND CURETTAGE OF UTERUS    . KNEE CARTILAGE SURGERY  7/04  . LEEP    . THYROIDECTOMY     sub  total and then irradiation  . TONSILLECTOMY AND ADENOIDECTOMY    . TUBAL LIGATION      Family History  Problem Relation Age of Onset  . Thyroid disease      gm, aunt and gp's  . Other Father     multiple cva's and crf  . Other Mother     pacer    Social History   Social History  . Marital status: Single    Spouse name: N/A  . Number of children: N/A  . Years of education: N/A   Social History Main Topics  . Smoking status: Former Smoker    Years: 24.00  . Smokeless tobacco: Never Used  . Alcohol use Yes     Comment: social  . Drug use: No  . Sexual activity: Not Asked   Other Topics Concern  . 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    Outpatient Medications Prior to Visit  Medication Sig Dispense Refill  . Ascorbic Acid (VITAMIN C) 1000 MG tablet Take 1,000 mg by mouth daily.    . calcium carbonate (OS-CAL) 600 MG TABS  Take 600 mg by mouth 2 (two) times daily with a meal.    . fish oil-omega-3 fatty acids 1000 MG capsule Take 2 g by mouth daily.    Marland Kitchen ibuprofen (ADVIL,MOTRIN) 200 MG tablet Take 200 mg by mouth every 6 (six) hours as needed.    . MULTIPLE VITAMIN PO Take by mouth.    . thiamine (VITAMIN B-1) 100 MG tablet Take 100 mg by mouth daily.    . VENTOLIN HFA 108 (90 Base) MCG/ACT inhaler   3  . vitamin D, CHOLECALCIFEROL, 400 UNITS tablet Take 400 Units by mouth daily.    Marland Kitchen VITAMIN E PO Take by mouth.    . SYNTHROID 75 MCG tablet TAKE 1 TABLET BY MOUTH DAILY 90 tablet 3   No facility-administered medications prior to visit.      EXAM:  BP (!) 160/78 (BP Location: Right Arm, Patient Position: Sitting, Cuff Size: Large)   Temp 97.5 F (36.4 C) (Oral)   Ht 5\' 2"  (1.575 m)   Wt 193 lb (87.5 kg)   BMI 35.30 kg/m   Body mass index is 35.3 kg/m.  Physical Exam: Vital signs  reviewed ONG:EXBM is a well-developed well-nourished alert cooperative    who appearsr stated age in no acute distress.  HEENT: normocephalic atraumatic , Eyes: PERRL EOM's full, conjunctiva clear, glasses  Nares: paten,t no deformity discharge or tenderness., Ears: no deformity EAC's clear TMs with normal landmarks. Mouth: clear OP, no lesions, edema.  Moist mucous membranes. Dentition in adequate repair. NECK: supple without masses, thyromegaly or bruits. CHEST/PULM:  Clear to auscultation and percussion breath sounds equal no wheeze , rales or rhonchi. No chest wall deformities or tenderness. CV: PMI is nondisplaced, S1 S2 no gallops,dont hear m or click today no  rubs. Peripheral pulses are full without delay.No JVD . Breast: normal by inspection . No dimpling, discharge, masses, tenderness or discharge . ABDOMEN: Bowel sounds normal nontender  No guard or rebound, no hepato splenomegal no CVA tenderness.   Extremtities:  No clubbing cyanosis or edema, no acute joint swelling or redness no focal atrophy NEURO:  Oriented x3, cranial nerves 3-12 appear to be intact, no obvious focal weakness,gait within normal limits no abnormal reflexes or asymmetrical SKIN: No acute rashes normal turgor, color, no bruising or petechiae.  Callus foot no ulcer  Or lesion  PSYCH: Oriented, good eye contact, no obvious depression anxiety, cognition and judgment appear normal. LN: no cervical axillary inguinal adenopathy  Lab Results  Component Value Date   WBC 5.4 01/11/2016   HGB 14.1 01/11/2016   HCT 41.0 01/11/2016   PLT 246.0 01/11/2016   GLUCOSE 105 (H) 01/11/2016   CHOL 216 (H) 01/11/2016   TRIG 80.0 01/11/2016   HDL 63.30 01/11/2016   LDLDIRECT 142.9 05/15/2012   LDLCALC 137 (H) 01/11/2016   ALT 17 01/11/2016   AST 18 01/11/2016   NA 142 01/11/2016   K 4.6 01/11/2016   CL 107 01/11/2016   CREATININE 0.91 01/11/2016   BUN 12 01/11/2016   CO2 28 01/11/2016   TSH 1.84 01/11/2016   BP Readings  from Last 3 Encounters:  01/19/16 (!) 160/78  01/11/16 (!) 144/80  09/10/13 140/80   Wt Readings from Last 3 Encounters:  01/19/16 193 lb (87.5 kg)  01/11/16 194 lb 12.8 oz (88.4 kg)  09/10/13 186 lb (84.4 kg)   Blood results reviewed  ASSESSMENT AND PLAN:  Discussed the following assessment and plan:  Visit for preventive health examination -  get mammo  cologuard plan   health statement written  Hypothyroidism, unspecified type - no change in med  Medication management  Elevated BP without diagnosis of hypertension - poss ht  check reading and begin ace as planned  can send in info my chart  [place] bmp [place]  Patient Care Team: Madelin HeadingsWanda K Geral Coker, MD as PCP - General Iva Booparl E Gessner, MD (Gastroenterology) Jerene BearsMary S Miller, MD as Attending Physician (Gynecology) [facility] Loletha CarrowEdward Hollander, MD (Ophthalmology) Patient Instructions   Your blood pressure is elevated today take reading twice a day for 5-7 days if 140/90 average and above begin medication. Advise ACE inhibitor. Newer guidelines advise 130/80 and below. Advise are OV in 3 months with readings and repeat a chemistry panel. If we begin medicine. Advise using my chart send in readings  Healthy lifestyle includes : At least 150 minutes of exercise weeks  , weight at healthy levels, which is usually   BMI 19-25. Avoid trans fats and processed foods;  Increase fresh fruits and veges to 5 servings per day. And avoid sweet beverages including tea and juice. Mediterranean diet with olive oil and nuts have been noted to be heart and brain healthy . Avoid tobacco products . Limit  alcohol to  7 per week for women and 14 servings for men.  Get adequate sleep . Wear seat belts . Don't text and drive .      Neta MendsWanda K. Oakes Mccready M.D.

## 2016-01-19 ENCOUNTER — Encounter: Payer: Self-pay | Admitting: Internal Medicine

## 2016-01-19 ENCOUNTER — Ambulatory Visit (INDEPENDENT_AMBULATORY_CARE_PROVIDER_SITE_OTHER): Payer: 59 | Admitting: Internal Medicine

## 2016-01-19 VITALS — BP 160/78 | Temp 97.5°F | Ht 62.0 in | Wt 193.0 lb

## 2016-01-19 DIAGNOSIS — R030 Elevated blood-pressure reading, without diagnosis of hypertension: Secondary | ICD-10-CM | POA: Diagnosis not present

## 2016-01-19 DIAGNOSIS — Z79899 Other long term (current) drug therapy: Secondary | ICD-10-CM

## 2016-01-19 DIAGNOSIS — E039 Hypothyroidism, unspecified: Secondary | ICD-10-CM | POA: Diagnosis not present

## 2016-01-19 DIAGNOSIS — Z0000 Encounter for general adult medical examination without abnormal findings: Secondary | ICD-10-CM | POA: Diagnosis not present

## 2016-01-19 MED ORDER — LISINOPRIL 10 MG PO TABS: 10.0000 mg | ORAL_TABLET | Freq: Every day | ORAL | 1 refills | Status: DC

## 2016-01-19 MED ORDER — SYNTHROID 75 MCG PO TABS: 75.0000 ug | ORAL_TABLET | Freq: Every day | ORAL | 3 refills | Status: DC

## 2016-01-19 NOTE — Patient Instructions (Signed)
  Your blood pressure is elevated today take reading twice a day for 5-7 days if 140/90 average and above begin medication. Advise ACE inhibitor. Newer guidelines advise 130/80 and below. Advise are OV in 3 months with readings and repeat a chemistry panel. If we begin medicine. Advise using my chart send in readings  Healthy lifestyle includes : At least 150 minutes of exercise weeks  , weight at healthy levels, which is usually   BMI 19-25. Avoid trans fats and processed foods;  Increase fresh fruits and veges to 5 servings per day. And avoid sweet beverages including tea and juice. Mediterranean diet with olive oil and nuts have been noted to be heart and brain healthy . Avoid tobacco products . Limit  alcohol to  7 per week for women and 14 servings for men.  Get adequate sleep . Wear seat belts . Don't text and drive .

## 2016-01-24 ENCOUNTER — Encounter: Payer: 59 | Admitting: Internal Medicine

## 2016-01-27 ENCOUNTER — Encounter: Payer: Self-pay | Admitting: Internal Medicine

## 2016-01-27 DIAGNOSIS — Z1211 Encounter for screening for malignant neoplasm of colon: Secondary | ICD-10-CM | POA: Diagnosis not present

## 2016-01-27 DIAGNOSIS — Z1212 Encounter for screening for malignant neoplasm of rectum: Secondary | ICD-10-CM | POA: Diagnosis not present

## 2016-02-03 ENCOUNTER — Encounter: Payer: Self-pay | Admitting: Family Medicine

## 2016-02-03 LAB — COLOGUARD: COLOGUARD: NEGATIVE

## 2016-04-12 MED FILL — SYNTHROID 75 MCG TABLET: 75 | 90 days supply | Qty: 90 | Fill #3

## 2016-06-19 MED FILL — SYNTHROID 75 MCG TABLET: 75 | 90 days supply | Qty: 90 | Fill #0

## 2016-09-29 ENCOUNTER — Encounter: Payer: Self-pay | Admitting: Internal Medicine

## 2016-09-30 MED FILL — SYNTHROID 75 MCG TABLET: 75 | 90 days supply | Qty: 90 | Fill #1

## 2017-01-16 DIAGNOSIS — H5203 Hypermetropia, bilateral: Secondary | ICD-10-CM | POA: Diagnosis not present

## 2017-01-16 DIAGNOSIS — H524 Presbyopia: Secondary | ICD-10-CM | POA: Diagnosis not present

## 2017-01-16 DIAGNOSIS — H52203 Unspecified astigmatism, bilateral: Secondary | ICD-10-CM | POA: Diagnosis not present

## 2017-01-16 DIAGNOSIS — H2513 Age-related nuclear cataract, bilateral: Secondary | ICD-10-CM | POA: Diagnosis not present

## 2017-01-19 NOTE — Progress Notes (Signed)
Chief Complaint  Patient presents with  . Annual Exam    NO new concerns    HPI: Patient  Denise Moran  77 y.o. comes in today for Preventive Health Care visit  And med management   Eye exam  To get utd   Albuterol once a month   Lipids   Ne meds lsi   bp  Doing at goal   Thyroid  meds   Stopped  Vitamins   Since doing well gets lots of sun   Last dexa 2088? -2.0 hip    Health Maintenance  Topic Date Due  . MAMMOGRAM  02/05/2012  . Fecal DNA (Cologuard)  01/27/2019  . TETANUS/TDAP  09/11/2023  . INFLUENZA VACCINE  Completed  . DEXA SCAN  Completed  . PNA vac Low Risk Adult  Completed   Health Maintenance Review LIFESTYLE:  Exercise:   Walking the beach .    Tobacco/ETS: no Alcohol:  1 per day or less  Sugar beverages:    ocass  Sleep: at  least 8  Drug use: no HH of  1-2  Work:  4 weeks  Alternating     40 hours and HH   12 hours  [place]  Another year.   May retire   Dental  New teeth.      ROS:  GEN/ HEENT: No fever, significant weight changes sweats headaches vision problems hearing changes, CV/ PULM; No chest pain shortness of breath cough, syncope,edema  change in exercise tolerance. GI /GU: No adominal pain, vomiting, change in bowel habits. No blood in the stool. No significant GU symptoms. SKIN/HEME: ,no acute skin rashes suspicious lesions or bleeding. No lymphadenopathy, nodules, masses.  NEURO/ PSYCH:  No neurologic signs such as weakness numbness. No depression anxiety. IMM/ Allergy: No unusual infections.  Allergy .   REST of 12 system review negative except as per HPI   Past Medical History:  Diagnosis Date  . Allergy   . Arthritis    knee  . Hypothyroidism   . MVP (mitral valve prolapse)    hx abnormal ekg with eval by cardiology last echo 2004  . Osteopenia     Past Surgical History:  Procedure Laterality Date  . ABDOMINAL HYSTERECTOMY    . DILATION AND CURETTAGE OF UTERUS    . KNEE CARTILAGE SURGERY  7/04    . LEEP    . THYROIDECTOMY     sub total and then irradiation  . TONSILLECTOMY AND ADENOIDECTOMY    . TUBAL LIGATION      Family History  Problem Relation Age of Onset  . Thyroid disease Unknown        gm, aunt and gp's  . Other Father        multiple cva's and crf  . Other Mother        pacer    Social History   Socioeconomic History  . Marital status: Single    Spouse name: None  . Number of children: None  . Years of education: None  . Highest education level: None  Social Needs  . Financial resource strain: None  . Food insecurity - worry: None  . Food insecurity - inability: None  . Transportation needs - medical: None  . Transportation needs - non-medical: None  Occupational History  . None  Tobacco Use  . Smoking status: Former Smoker    Years: 24.00  . Smokeless tobacco: Never Used  Substance and Sexual Activity  . Alcohol use:  Yes    Comment: social  . Drug use: No  . Sexual activity: None  Other Topics Concern  . 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 SYNTHROID 75 MCG tablet Take 1 tablet (75 mcg total) by mouth daily. 90 tablet 3  . VENTOLIN HFA 108 (90 Base) MCG/ACT inhaler   3  . Ascorbic Acid (VITAMIN C) 1000 MG tablet Take 1,000 mg by mouth daily.    . calcium carbonate (OS-CAL) 600 MG TABS Take 600 mg by mouth 2 (two) times daily with a meal.    . fish oil-omega-3 fatty acids 1000 MG capsule Take 2 g by mouth daily.    Marland Kitchen ibuprofen (ADVIL,MOTRIN) 200 MG tablet Take 200 mg by mouth every 6 (six) hours as needed.    Marland Kitchen lisinopril (PRINIVIL,ZESTRIL) 10 MG tablet Take 1 tablet (10 mg total) by mouth daily. (Patient not taking: Reported on 01/22/2017) 90 tablet 1   . MULTIPLE VITAMIN PO Take by mouth.    . thiamine (VITAMIN B-1) 100 MG tablet Take 100 mg by mouth daily.    . vitamin D, CHOLECALCIFEROL, 400 UNITS tablet Take 400 Units by mouth daily.    Marland Kitchen VITAMIN E PO Take by mouth.     No facility-administered medications prior to visit.      EXAM:  BP 120/78 (BP Location: Left Arm, Patient Position: Sitting, Cuff Size: Normal)   Pulse 69   Temp 98.4 F (36.9 C) (Oral)   Ht 5' 1.5" (1.562 m)   Wt 181 lb 4.8 oz (82.2 kg)   BMI 33.70 kg/m   Body mass index is 33.7 kg/m. Wt Readings from Last 3 Encounters:  01/22/17 181 lb 4.8 oz (82.2 kg)  01/19/16 193 lb (87.5 kg)  01/11/16 194 lb 12.8 oz (88.4 kg)    Physical Exam: Vital signs reviewed OFB:PZWC is a well-developed well-nourished alert cooperative    who appearsr stated age in no acute distress.  HEENT: normocephalic atraumatic , Eyes: PERRL EOM's full, conjunctiva clear, Nares: paten,t no deformity discharge or tenderness., Ears: no deformity EAC's clear TMs with normal landmarks. Mouth: clear OP, no lesions, edema.  Moist mucous membranes. Dentition in adequate repair. New upper teeth  NECK: supple without masses, thyromegaly or bruits. CHEST/PULM:  Clear to auscultation and percussion breath sounds equal no wheeze , rales or rhonchi. No chest wall deformities or tenderness. Breast: normal by inspection . No dimpling, discharge, masses, tenderness or discharge . CV: PMI is nondisplaced, S1 S2 no gallops, murmurs, rubs. Peripheral pulses are full without delay.No JVD .  ABDOMEN: Bowel sounds normal nontender  No guard or rebound, no hepato splenomegal no CVA tenderness.   Extremtities:  No clubbing cyanosis or edema, no acute joint swelling or redness no focal atrophy NEURO:  Oriented x3, cranial nerves 3-12 appear to be intact, no obvious focal weakness,gait within normal limits no abnormal reflexes  SKIN: No acute rashes normal turgor, color, no bruising or petechiae. PSYCH:  Oriented, good eye contact, no obvious depression anxiety, cognition and judgment appear normal. LN: no cervical axillary inguinal adenopathy    BP Readings from  Last 3 Encounters:  01/22/17 120/78  01/19/16 (!) 160/78  01/11/16 (!) 144/80    Lab results reviewed with patient   ASSESSMENT AND PLAN:  Discussed the following assessment and plan:  Visit for preventive health examination - Plan: Basic metabolic panel, CBC with Differential/Platelet, Hepatic function panel, Lipid panel, TSH  Hypothyroidism, unspecified type - Plan: Basic metabolic panel, CBC with Differential/Platelet, Hepatic function panel, Lipid panel, TSH  Medication management - Plan: Basic metabolic panel, CBC with Differential/Platelet, Hepatic function panel, Lipid panel, TSH  Essential hypertension - Plan: Basic metabolic panel, CBC with Differential/Platelet, Hepatic function panel, Lipid panel, TSH  Estrogen deficiency - Plan: DG Bone Density, Basic metabolic panel, CBC with Differential/Platelet, Hepatic function panel, Lipid panel, TSH  Osteopenia, unspecified location - dexa 2008 -2.0 hip  Patient Care Team: Burnis Medin, MD as PCP - General Carlean Purl Ofilia Neas, MD (Gastroenterology) Megan Salon, MD as Attending Physician (Gynecology) [facility] Ralene Bathe, MD (Ophthalmology) Patient Instructions  Get mammogram and d bone density ( breast center order.)   Will notify you  of labs when available.   Glad you are doing well.   Check into shingles vaccine. Shingrix  Can receive either in the office or pharmacy depending on insurance rules.    Preventive Care 89 Years and Older, Female Preventive care refers to lifestyle choices and visits with your health care provider that can promote health and wellness. What does preventive care include?  A yearly physical exam. This is also called an annual well check.  Dental exams once or twice a year.  Routine eye exams. Ask your health  care provider how often you should have your eyes checked.  Personal lifestyle choices, including: ? Daily care of your teeth and gums. ? Regular physical activity. ? Eating a healthy diet. ? Avoiding tobacco and drug use. ? Limiting alcohol use. ? Practicing safe sex. ? Taking low-dose aspirin every day. ? Taking vitamin and mineral supplements as recommended by your health care provider. What happens during an annual well check? The services and screenings done by your health care provider during your annual well check will depend on your age, overall health, lifestyle risk factors, and family history of disease. Counseling Your health care provider may ask you questions about your:  Alcohol use.  Tobacco use.  Drug use.  Emotional well-being.  Home and relationship well-being.  Sexual activity.  Eating habits.  History of falls.  Memory and ability to understand (cognition).  Work and work Statistician.  Reproductive health.  Screening You may have the following tests or measurements:  Height, weight, and BMI.  Blood pressure.  Lipid and cholesterol levels. These may be checked every 5 years, or more frequently if you are over 75 years old.  Skin check.  Lung cancer screening. You may have this screening every year starting at age 56 if you have a 30-pack-year history of smoking and currently smoke or have quit within the past 15 years.  Fecal occult blood test (FOBT) of the stool. You may have this test every year starting at age 66.  Flexible sigmoidoscopy or colonoscopy. You may have a sigmoidoscopy every 5 years or a colonoscopy every 10 years starting at age 41.  Hepatitis C blood test.  Hepatitis B blood test.  Sexually transmitted disease (STD) testing.  Diabetes screening. This is done by checking your blood sugar (glucose) after you have not eaten for a while (fasting). You may have this done every 1-3 years.  Bone density scan. This is done  to screen for osteoporosis. You may have this done starting at age 68.  Mammogram. This may be done every 1-2 years. Talk to your health care provider about how often you should have regular mammograms.  Talk with your health care provider about your test results, treatment options, and if necessary, the need for more tests. Vaccines Your health care provider may recommend certain vaccines, such as:  Influenza vaccine. This is recommended every year.  Tetanus, diphtheria, and acellular pertussis (Tdap, Td) vaccine. You may need a Td booster every 10 years.  Varicella vaccine. You may need this if you have not been vaccinated.  Zoster vaccine. You may need this after age 42.  Measles, mumps, and rubella (MMR) vaccine. You may need at least one dose of MMR if you were born in 1957 or later. You may also need a second dose.  Pneumococcal 13-valent conjugate (PCV13) vaccine. One dose is recommended after age 6.  Pneumococcal polysaccharide (PPSV23) vaccine. One dose is recommended after age 83.  Meningococcal vaccine. You may need this if you have certain conditions.  Hepatitis A vaccine. You may need this if you have certain conditions or if you travel or work in places where you may be exposed to hepatitis A.  Hepatitis B vaccine. You may need this if you have certain conditions or if you travel or work in places where you may be exposed to hepatitis B.  Haemophilus influenzae type b (Hib) vaccine. You may need this if you have certain conditions.  Talk to your health care provider about which screenings and vaccines you need and how often you need them. This information is not intended to replace advice given to you by your health care provider. Make sure you discuss any questions you have with your health care provider. Document Released: 01/22/2015 Document Revised: 09/15/2015 Document Reviewed: 10/27/2014 Elsevier Interactive Patient Education  2018 [place].  Gali Spinney M.D.

## 2017-01-22 ENCOUNTER — Encounter: Payer: Self-pay | Admitting: Internal Medicine

## 2017-01-22 ENCOUNTER — Other Ambulatory Visit: Payer: Self-pay | Admitting: Internal Medicine

## 2017-01-22 ENCOUNTER — Ambulatory Visit (INDEPENDENT_AMBULATORY_CARE_PROVIDER_SITE_OTHER): Payer: 59 | Admitting: Internal Medicine

## 2017-01-22 VITALS — BP 120/78 | HR 69 | Temp 98.4°F | Ht 61.5 in | Wt 181.3 lb

## 2017-01-22 DIAGNOSIS — I10 Essential (primary) hypertension: Secondary | ICD-10-CM

## 2017-01-22 DIAGNOSIS — Z79899 Other long term (current) drug therapy: Secondary | ICD-10-CM

## 2017-01-22 DIAGNOSIS — Z0000 Encounter for general adult medical examination without abnormal findings: Secondary | ICD-10-CM | POA: Diagnosis not present

## 2017-01-22 DIAGNOSIS — M8580 Other specified disorders of bone density and structure, unspecified site: Secondary | ICD-10-CM

## 2017-01-22 DIAGNOSIS — E039 Hypothyroidism, unspecified: Secondary | ICD-10-CM

## 2017-01-22 DIAGNOSIS — E2839 Other primary ovarian failure: Secondary | ICD-10-CM | POA: Diagnosis not present

## 2017-01-22 LAB — BASIC METABOLIC PANEL
BUN: 13 mg/dL (ref 6–23)
CHLORIDE: 103 meq/L (ref 96–112)
CO2: 30 mEq/L (ref 19–32)
CREATININE: 0.88 mg/dL (ref 0.40–1.20)
Calcium: 9.6 mg/dL (ref 8.4–10.5)
GFR: 66.69 mL/min (ref >60.00)
GLUCOSE: 95 mg/dL (ref 70–99)
POTASSIUM: 4.7 meq/L (ref 3.5–5.1)
Sodium: 141 mEq/L (ref 135–145)

## 2017-01-22 LAB — HEPATIC FUNCTION PANEL
ALT: 15 U/L (ref 0–35)
AST: 14 U/L (ref 0–37)
Albumin: 4.3 g/dL (ref 3.5–5.2)
Alkaline Phosphatase: 78 U/L (ref 39–117)
BILIRUBIN DIRECT: 0.1 mg/dL (ref 0.0–0.3)
BILIRUBIN TOTAL: 0.6 mg/dL (ref 0.2–1.2)
Total Protein: 7 g/dL (ref 6.0–8.3)

## 2017-01-22 LAB — LIPID PANEL
CHOL/HDL RATIO: 4
CHOLESTEROL: 229 mg/dL — AB (ref 0–200)
HDL: 60.9 mg/dL (ref >39.00)
LDL CALC: 149 mg/dL — AB (ref 0–99)
NonHDL: 168.26
TRIGLYCERIDES: 95 mg/dL (ref 0.0–149.0)
VLDL: 19 mg/dL (ref 0.0–40.0)

## 2017-01-22 LAB — CBC WITH DIFFERENTIAL/PLATELET
BASOS PCT: 0.7 % (ref 0.0–3.0)
Basophils Absolute: 0 10*3/uL (ref 0.0–0.1)
EOS ABS: 0.1 10*3/uL (ref 0.0–0.7)
EOS PCT: 2.2 % (ref 0.0–5.0)
HCT: 43.5 % (ref 36.0–46.0)
Hemoglobin: 14.4 g/dL (ref 12.0–15.0)
LYMPHS ABS: 3 10*3/uL (ref 0.7–4.0)
Lymphocytes Relative: 49.9 % — ABNORMAL HIGH (ref 12.0–46.0)
MCHC: 33.2 g/dL (ref 30.0–36.0)
MCV: 96.6 fl (ref 78.0–100.0)
MONO ABS: 0.4 10*3/uL (ref 0.1–1.0)
Monocytes Relative: 6.9 % (ref 3.0–12.0)
NEUTROS ABS: 2.4 10*3/uL (ref 1.4–7.7)
NEUTROS PCT: 40.3 % — AB (ref 43.0–77.0)
PLATELETS: 277 10*3/uL (ref 150.0–400.0)
RBC: 4.51 Mil/uL (ref 3.87–5.11)
RDW: 14.2 % (ref 11.5–15.5)
WBC: 6 10*3/uL (ref 4.0–10.5)

## 2017-01-22 LAB — TSH: TSH: 1.06 u[IU]/mL (ref 0.35–4.50)

## 2017-01-22 NOTE — Patient Instructions (Signed)
Get mammogram and d bone density ( breast center order.)   Will notify you  of labs when available.   Glad you are doing well.   Check into shingles vaccine. Shingrix  Can receive either in the office or pharmacy depending on insurance rules.    Preventive Care 75 Years and Older, Female Preventive care refers to lifestyle choices and visits with your health care provider that can promote health and wellness. What does preventive care include?  A yearly physical exam. This is also called an annual well check.  Dental exams once or twice a year.  Routine eye exams. Ask your health care provider how often you should have your eyes checked.  Personal lifestyle choices, including: ? Daily care of your teeth and gums. ? Regular physical activity. ? Eating a healthy diet. ? Avoiding tobacco and drug use. ? Limiting alcohol use. ? Practicing safe sex. ? Taking low-dose aspirin every day. ? Taking vitamin and mineral supplements as recommended by your health care provider. What happens during an annual well check? The services and screenings done by your health care provider during your annual well check will depend on your age, overall health, lifestyle risk factors, and family history of disease. Counseling Your health care provider may ask you questions about your:  Alcohol use.  Tobacco use.  Drug use.  Emotional well-being.  Home and relationship well-being.  Sexual activity.  Eating habits.  History of falls.  Memory and ability to understand (cognition).  Work and work Statistician.  Reproductive health.  Screening You may have the following tests or measurements:  Height, weight, and BMI.  Blood pressure.  Lipid and cholesterol levels. These may be checked every 5 years, or more frequently if you are over 60 years old.  Skin check.  Lung cancer screening. You may have this screening every year starting at age 75 if you have a 30-pack-year history of  smoking and currently smoke or have quit within the past 15 years.  Fecal occult blood test (FOBT) of the stool. You may have this test every year starting at age 75.  Flexible sigmoidoscopy or colonoscopy. You may have a sigmoidoscopy every 5 years or a colonoscopy every 10 years starting at age 75.  Hepatitis C blood test.  Hepatitis B blood test.  Sexually transmitted disease (STD) testing.  Diabetes screening. This is done by checking your blood sugar (glucose) after you have not eaten for a while (fasting). You may have this done every 1-3 years.  Bone density scan. This is done to screen for osteoporosis. You may have this done starting at age 75.  Mammogram. This may be done every 1-2 years. Talk to your health care provider about how often you should have regular mammograms.  Talk with your health care provider about your test results, treatment options, and if necessary, the need for more tests. Vaccines Your health care provider may recommend certain vaccines, such as:  Influenza vaccine. This is recommended every year.  Tetanus, diphtheria, and acellular pertussis (Tdap, Td) vaccine. You may need a Td booster every 10 years.  Varicella vaccine. You may need this if you have not been vaccinated.  Zoster vaccine. You may need this after age 63.  Measles, mumps, and rubella (MMR) vaccine. You may need at least one dose of MMR if you were born in 1957 or later. You may also need a second dose.  Pneumococcal 13-valent conjugate (PCV13) vaccine. One dose is recommended after age 75.  Pneumococcal  polysaccharide (PPSV23) vaccine. One dose is recommended after age 75.  Meningococcal vaccine. You may need this if you have certain conditions.  Hepatitis A vaccine. You may need this if you have certain conditions or if you travel or work in places where you may be exposed to hepatitis A.  Hepatitis B vaccine. You may need this if you have certain conditions or if you travel or  work in places where you may be exposed to hepatitis B.  Haemophilus influenzae type b (Hib) vaccine. You may need this if you have certain conditions.  Talk to your health care provider about which screenings and vaccines you need and how often you need them. This information is not intended to replace advice given to you by your health care provider. Make sure you discuss any questions you have with your health care provider. Document Released: 01/22/2015 Document Revised: 09/15/2015 Document Reviewed: 10/27/2014 Elsevier Interactive Patient Education  [employer].

## 2017-01-24 ENCOUNTER — Telehealth: Payer: Self-pay | Admitting: Internal Medicine

## 2017-01-24 MED ORDER — SYNTHROID 75 MCG PO TABS: 75.0000 ug | ORAL_TABLET | Freq: Every day | ORAL | 3 refills | Status: DC

## 2017-01-24 MED FILL — SYNTHROID 75 MCG TABLET: 75 | 90 days supply | Qty: 90 | Fill #0

## 2017-01-24 NOTE — Telephone Encounter (Signed)
OV: 01/22/17   LR: 01/19/16 TSH: 01/22/17 : 1.06 Refilled for 1 year

## 2017-01-24 NOTE — Telephone Encounter (Signed)
Copied from CRM [id number]. Topic: General - Other >> Jan 24, 2017  9:09 AM Cecelia ByarsGreen, Temeka L, RMA wrote: Reason for CRM: Medication refill request for Synthroid 75 mcg to be [facility] to [facility]

## 2017-03-23 ENCOUNTER — Other Ambulatory Visit: Payer: Self-pay | Admitting: Internal Medicine

## 2017-03-23 DIAGNOSIS — Z139 Encounter for screening, unspecified: Secondary | ICD-10-CM

## 2017-03-27 MED FILL — SYNTHROID 75 MCG TABLET: 75 | 90 days supply | Qty: 90 | Fill #1

## 2017-04-30 ENCOUNTER — Ambulatory Visit
Admission: RE | Admit: 2017-04-30 | Discharge: 2017-04-30 | Disposition: A | Discharge status: Home / Self Care | Payer: 59 | Source: Ambulatory Visit | Attending: Internal Medicine | Admitting: Internal Medicine

## 2017-04-30 DIAGNOSIS — Z139 Encounter for screening, unspecified: Secondary | ICD-10-CM

## 2017-04-30 DIAGNOSIS — M8589 Other specified disorders of bone density and structure, multiple sites: Secondary | ICD-10-CM | POA: Diagnosis not present

## 2017-04-30 DIAGNOSIS — Z780 Asymptomatic menopausal state: Secondary | ICD-10-CM | POA: Diagnosis not present

## 2017-04-30 DIAGNOSIS — Z1231 Encounter for screening mammogram for malignant neoplasm of breast: Secondary | ICD-10-CM | POA: Diagnosis not present

## 2017-04-30 DIAGNOSIS — E2839 Other primary ovarian failure: Secondary | ICD-10-CM

## 2017-07-13 MED FILL — SYNTHROID 75 MCG TABLET: 75 | 90 days supply | Qty: 90 | Fill #2

## 2017-10-23 MED FILL — SYNTHROID 75 MCG TABLET: 75 | 90 days supply | Qty: 90 | Fill #3

## 2017-12-12 NOTE — Progress Notes (Signed)
Chief Complaint  Patient presents with  . Follow-up    Pt states that she has been having issues with her allergies, coughing andchest congestion. Pt also notes having a spell of severe abdominal pain x 12 hrs. Pt states that she felt like her whole abdomin was burning inside. Pt also reports having increased frequent urination during this time. Denies any diarrhea/vomiting but was nauseated. No fever noted.     HPI:  Denise Moran 77 y.o. come in for   Number of issues   Grand child to be born compliccate hx  Congenital diaphragmatic hernia   And wants  To review   Immunizations  Has had 3 mmrs and had disease when younger  No mumps.  ? tdap   To be moving back to [place] and stopping work for [place] end of year    And after baby is born     had [place] .    Seems to better there and recurss when [place] in [place].    Hx of histo  scars   On x ray .   Cough  Ex   smokr   Inhaler  Here a week this time .  No sob no fever   Other  hemoptysis fever chills   Uses [place] ventolin with help   Had an episode weeks ago     of bloating   And then burning inside   Whole abdomen. But 2 soft bms  And had 12 hours discomfit with extreme nausea  Before subsiding  No hx of same  And ok now   Has had some   And  uirna frequency .  Clear   Urine .  3-4 days to do better.   No vomiting diarrhea or blood   Thyroid needs refills .   .   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  ROS: See pertinent positives and negatives per HPI.  Past Medical History:  Diagnosis Date  . Allergy   . Arthritis    knee  . Hypothyroidism   . MVP (mitral  valve prolapse)    hx abnormal ekg with eval by cardiology last echo 2004  . Osteopenia     Family History  Problem Relation Age of Onset  . Other Mother        pacer  . Thyroid disease Unknown        gm, aunt and gp's  . Other Father        multiple cva's and crf    Social History   Socioeconomic History  . Marital status: Single    Spouse name: Not on file  . Number of children: Not on file  . Years of education: Not on file  . Highest education level: Not on file  Occupational History  . Not on file  Social Needs  . Financial resource strain: Not on file  . Food insecurity:    Worry: Not on file    Inability: Not on file  . Transportation needs:    Medical: Not on file    Non-medical: Not on file  Tobacco Use  . Smoking status: Former Smoker  Years: 24.00  . Smokeless tobacco: Never Used  Substance and Sexual Activity  . Alcohol use: Yes    Comment: social  . Drug use: No  . Sexual activity: Not on file  Lifestyle  . Physical activity:    Days per week: Not on file    Minutes per session: Not on file  . Stress: Not on file  Relationships  . Social connections:    Talks on phone: Not on file    Gets together: Not on file    Attends religious service: Not on file    Active member of club or organization: Not on file    Attends meetings of clubs or organizations: Not on file    Relationship status: Not on file  Other Topics Concern  . 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 SYNTHROID 75 MCG tablet TAKE 1 TABLET BY MOUTH ONCE DAILY (Patient not taking: Reported on 12/14/2017) 90 tablet 3  . SYNTHROID 75 MCG tablet Take 1 tablet (75 mcg total) by mouth daily.  90 tablet 3  . VENTOLIN HFA 108 (90 Base) MCG/ACT inhaler   3   No facility-administered medications prior to visit.      EXAM:  BP (!) 144/80 (BP Location: Left Arm, Patient Position: Sitting, Cuff Size: Normal)   Pulse 70   Temp 98.2 F (36.8 C) (Oral)   Wt 198 lb 14.4 oz (90.2 kg)   SpO2 98%   BMI 36.97 kg/m   Body mass index is 36.97 kg/m.  GENERAL: vitals reviewed and listed above, alert, oriented, appears well hydrated and in no acute distress HEENT: atraumatic, conjunctiva  clear, no obvious abnormalities on inspection of external nose and ears tmx clear  OP : no lesion edema or exudate  NECK: no obvious masses on inspection palpation  LUNGS: clear to auscultation bilaterally, no wheezes, rales or rhonchi, good air movement CV: HRRR, no clubbing cyanosis or  peripheral edema nl cap refill   There is a  Long systolic  Hum like sx upper  Chest right  More than left  but no radiation to neck  Or apex  Diastole seems clear > Abdomen:  Sof,t normal bowel sounds without hepatosplenomegaly, no guarding rebound or masses no CVA tenderness Skin: normal capillary refill ,turgor , color: No acute rashes ,petechiae or bruising brown mole on right leg looks benign  To follow  MS: moves all extremities without noticeable focal  abnormality PSYCH: pleasant and cooperative, no obvious depression or anxiety Lab Results  Component Value Date   WBC 6.5 12/14/2017   HGB 13.7 12/14/2017   HCT 40.4 12/14/2017   PLT 255.0 12/14/2017   GLUCOSE 92 12/14/2017   CHOL 201 (H) 12/14/2017   TRIG 113.0 12/14/2017   HDL 51.80 12/14/2017   LDLDIRECT 142.9 05/15/2012   LDLCALC 126 (H) 12/14/2017   ALT 13 12/14/2017   AST 16 12/14/2017   NA 143 12/14/2017   K 4.6 12/14/2017   CL 107 12/14/2017   CREATININE 0.96 12/14/2017   BUN 17 12/14/2017   CO2 29 12/14/2017   TSH 9.36 (H) 12/14/2017  BP Readings from Last 3 Encounters:  12/14/17 (!) 144/80  01/22/17 120/78  01/19/16 (!) 160/78     ASSESSMENT AND PLAN:  Discussed the following assessment and plan:  Cough, persistent - Plan: Basic metabolic panel, CBC with Differential/Platelet, Hepatic function panel, Lipid panel, TSH, DG Chest 2 View, DG Chest 2 View  Medication management - Plan: Basic metabolic panel, CBC with Differential/Platelet, Hepatic function panel, Lipid panel, TSH, DG Chest 2 View, DG Chest 2 View  Elevated BP without diagnosis of hypertension - Plan: Basic metabolic panel, CBC with Differential/Platelet, Hepatic function panel, Lipid panel, TSH, DG Chest 2 View, DG Chest 2 View  Hypothyroidism, unspecified type - Plan: Basic metabolic panel, CBC with Differential/Platelet, Hepatic function panel, Lipid panel, TSH, DG Chest 2 View, DG Chest 2 View  Hyperlipidemia, unspecified hyperlipidemia type - Plan: Basic metabolic panel, CBC with Differential/Platelet, Hepatic function panel, Lipid panel, TSH, DG Chest 2 View, DG Chest 2 View  Murmur - not noted in last note  and atypical but  no sx  cv etc hx mvp but not location of typical mr murmur - Plan: ECHOCARDIOGRAM COMPLETE  Immunization counseling - optinos disc but is  UTD and hs had 3 MMRs  by report  multiple issues addressed today  And new ones  And plan  She will be  Losing current insurance  Jan 3rd   And moving back to [place] .   After GC is born  Here for  Grandchild to be born  With complications  Followed at [facility]   -Patient advised to return or notify health care team  if  new concerns arise.  Patient Instructions   Plan lab today  X ray as planned .  Echocardiogram.  To check vascular sound in uuper chest  Labs today ( non fasting)   bp goal 120/80 or 130/80  If not acchieved consider adding medication  To help.  Exam is reassuring today  It is possible  Gi s could have been a   Gall bladder attack.    immuniz seem utd   Immunization History  Administered Date(s) Administered  . Pneumococcal Conjugate-13 09/10/2013  . Pneumococcal  Polysaccharide-23 04/29/2008  . Td 01/10/2003  . Tdap 09/10/2013  . Zoster 02/27/2007      Neta MendsWanda K. Panosh M.D.

## 2017-12-14 ENCOUNTER — Ambulatory Visit: Payer: 59 | Admitting: Internal Medicine

## 2017-12-14 ENCOUNTER — Encounter: Payer: Self-pay | Admitting: Internal Medicine

## 2017-12-14 ENCOUNTER — Ambulatory Visit (INDEPENDENT_AMBULATORY_CARE_PROVIDER_SITE_OTHER): Payer: 59

## 2017-12-14 VITALS — BP 144/80 | HR 70 | Temp 98.2°F | Wt 198.9 lb

## 2017-12-14 DIAGNOSIS — R011 Cardiac murmur, unspecified: Secondary | ICD-10-CM

## 2017-12-14 DIAGNOSIS — R05 Cough: Secondary | ICD-10-CM

## 2017-12-14 DIAGNOSIS — R030 Elevated blood-pressure reading, without diagnosis of hypertension: Secondary | ICD-10-CM

## 2017-12-14 DIAGNOSIS — Z7185 Encounter for immunization safety counseling: Secondary | ICD-10-CM

## 2017-12-14 DIAGNOSIS — E785 Hyperlipidemia, unspecified: Secondary | ICD-10-CM | POA: Diagnosis not present

## 2017-12-14 DIAGNOSIS — E039 Hypothyroidism, unspecified: Secondary | ICD-10-CM | POA: Diagnosis not present

## 2017-12-14 DIAGNOSIS — R053 Chronic cough: Secondary | ICD-10-CM

## 2017-12-14 DIAGNOSIS — Z79899 Other long term (current) drug therapy: Secondary | ICD-10-CM | POA: Diagnosis not present

## 2017-12-14 DIAGNOSIS — Z7189 Other specified counseling: Secondary | ICD-10-CM | POA: Diagnosis not present

## 2017-12-14 LAB — BASIC METABOLIC PANEL
BUN: 17 mg/dL (ref 6–23)
CHLORIDE: 107 meq/L (ref 96–112)
CO2: 29 meq/L (ref 19–32)
Calcium: 9.8 mg/dL (ref 8.4–10.5)
Creatinine, Ser: 0.96 mg/dL (ref 0.40–1.20)
GFR: 60.17 mL/min (ref >60.00)
Glucose, Bld: 92 mg/dL (ref 70–99)
POTASSIUM: 4.6 meq/L (ref 3.5–5.1)
Sodium: 143 mEq/L (ref 135–145)

## 2017-12-14 LAB — HEPATIC FUNCTION PANEL
ALT: 13 U/L (ref 0–35)
AST: 16 U/L (ref 0–37)
Albumin: 4.1 g/dL (ref 3.5–5.2)
Alkaline Phosphatase: 62 U/L (ref 39–117)
BILIRUBIN DIRECT: 0.1 mg/dL (ref 0.0–0.3)
TOTAL PROTEIN: 6.5 g/dL (ref 6.0–8.3)
Total Bilirubin: 0.4 mg/dL (ref 0.2–1.2)

## 2017-12-14 LAB — LIPID PANEL
CHOL/HDL RATIO: 4
Cholesterol: 201 mg/dL — ABNORMAL HIGH (ref 0–200)
HDL: 51.8 mg/dL (ref >39.00)
LDL Cholesterol: 126 mg/dL — ABNORMAL HIGH (ref 0–99)
NONHDL: 148.73
Triglycerides: 113 mg/dL (ref 0.0–149.0)
VLDL: 22.6 mg/dL (ref 0.0–40.0)

## 2017-12-14 LAB — CBC WITH DIFFERENTIAL/PLATELET
Basophils Absolute: 0 10*3/uL (ref 0.0–0.1)
Basophils Relative: 0.6 % (ref 0.0–3.0)
EOS PCT: 3.4 % (ref 0.0–5.0)
Eosinophils Absolute: 0.2 10*3/uL (ref 0.0–0.7)
HEMATOCRIT: 40.4 % (ref 36.0–46.0)
HEMOGLOBIN: 13.7 g/dL (ref 12.0–15.0)
LYMPHS PCT: 46.6 % — AB (ref 12.0–46.0)
Lymphs Abs: 3 10*3/uL (ref 0.7–4.0)
MCHC: 33.9 g/dL (ref 30.0–36.0)
MCV: 94.9 fl (ref 78.0–100.0)
Monocytes Absolute: 0.5 10*3/uL (ref 0.1–1.0)
Monocytes Relative: 7.3 % (ref 3.0–12.0)
Neutro Abs: 2.7 10*3/uL (ref 1.4–7.7)
Neutrophils Relative %: 42.1 % — ABNORMAL LOW (ref 43.0–77.0)
PLATELETS: 255 10*3/uL (ref 150.0–400.0)
RBC: 4.26 Mil/uL (ref 3.87–5.11)
RDW: 14.5 % (ref 11.5–15.5)
WBC: 6.5 10*3/uL (ref 4.0–10.5)

## 2017-12-14 LAB — TSH: TSH: 9.36 u[IU]/mL — ABNORMAL HIGH (ref 0.35–4.50)

## 2017-12-14 IMAGING — DX DG CHEST 2V
2 series · 2 of 2 positions shown · non-contrast
Comparison: None.

CLINICAL DATA: Hypertension cough

EXAM:
CHEST - 2 VIEW

[chest pa]
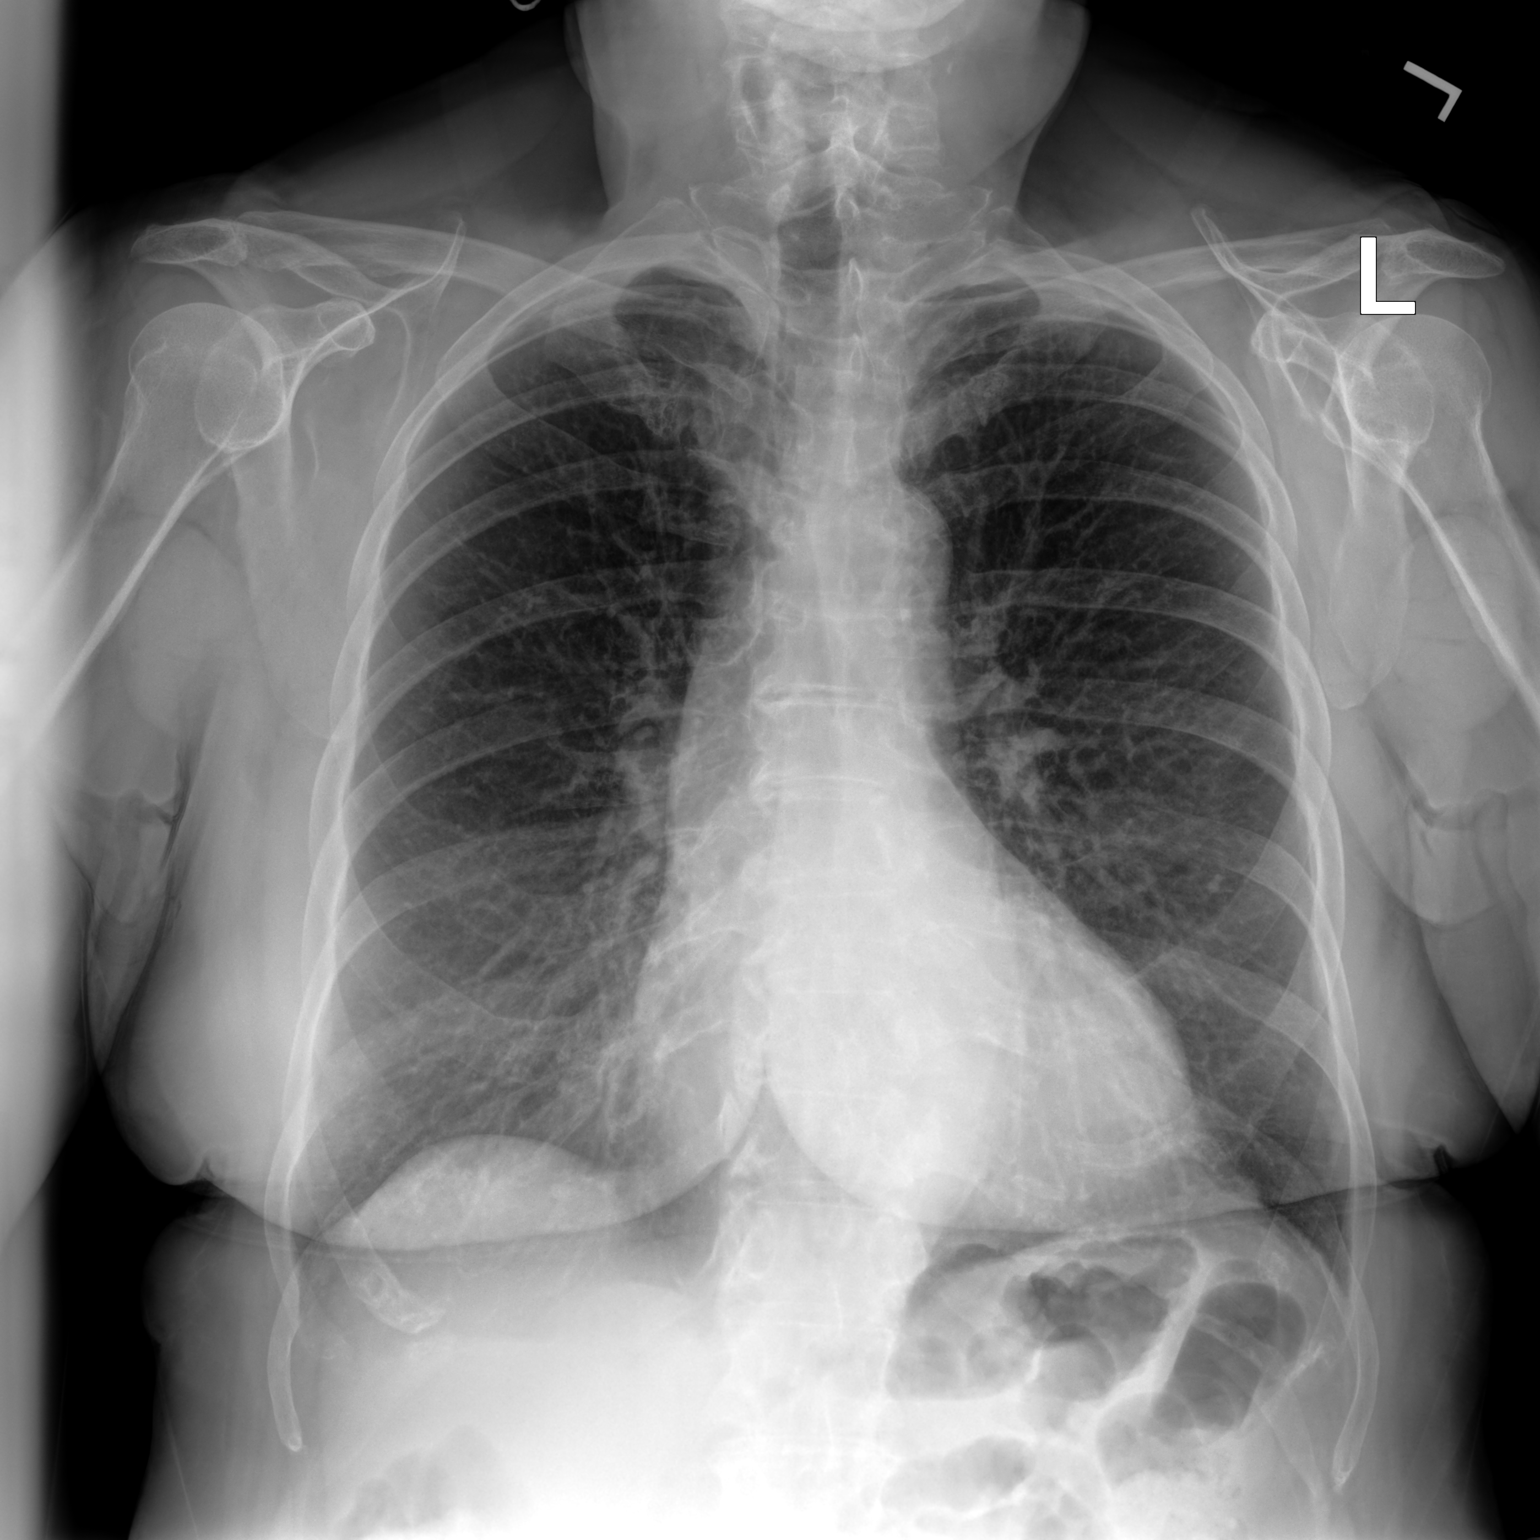

[chest lat]
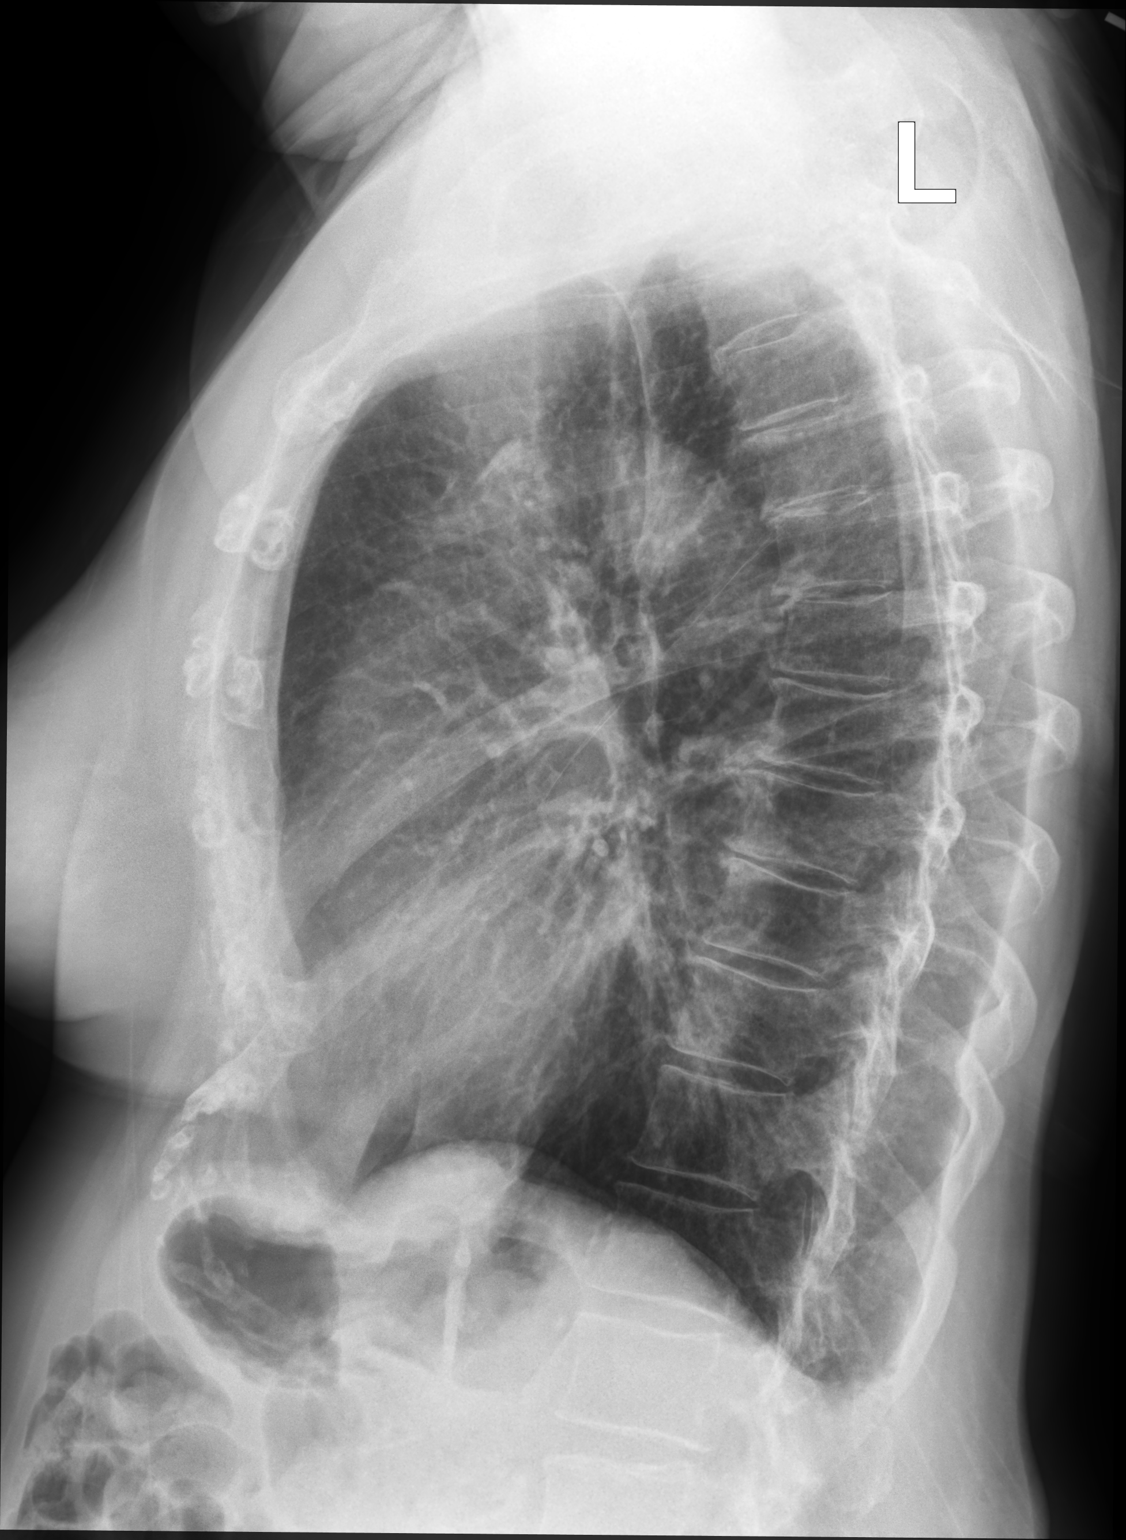

[2 of 2 positions shown; findings below may reference images not displayed]

FINDINGS: Pulmonary hyperinflation. Negative for infiltrate effusion or mass.
Heart size and vascularity within normal limits. No acute skeletal
abnormality.
IMPRESSION: Pulmonary hyperinflation without acute abnormality.

## 2017-12-14 MED ORDER — SYNTHROID 75 MCG PO TABS: 75.0000 ug | ORAL_TABLET | Freq: Every day | ORAL | 3 refills | Status: AC

## 2017-12-14 MED ORDER — VENTOLIN HFA 108 (90 BASE) MCG/ACT IN AERS: 1.0000 | INHALATION_SPRAY | Freq: Four times a day (QID) | RESPIRATORY_TRACT | 3 refills | Status: AC | PRN

## 2017-12-14 MED FILL — VENTOLIN HFA 90 MCG INHALER: 108 (90 BAS | 25 days supply | Qty: 18 | Fill #0

## 2017-12-14 NOTE — Patient Instructions (Addendum)
Plan lab today  X ray as planned .  Echocardiogram.  To check vascular sound in uuper chest  Labs today ( non fasting)   bp goal 120/80 or 130/80  If not acchieved consider adding medication  To help.  Exam is reassuring today  It is possible  Gi s could have been a   Gall bladder attack.    immuniz seem utd   Immunization History  Administered Date(s) Administered  . Pneumococcal Conjugate-13 09/10/2013  . Pneumococcal Polysaccharide-23 04/29/2008  . Td 01/10/2003  . Tdap 09/10/2013  . Zoster 02/27/2007

## 2017-12-19 ENCOUNTER — Other Ambulatory Visit: Payer: Self-pay | Admitting: Internal Medicine

## 2017-12-19 DIAGNOSIS — E039 Hypothyroidism, unspecified: Secondary | ICD-10-CM

## 2017-12-21 ENCOUNTER — Ambulatory Visit (HOSPITAL_COMMUNITY): Payer: 59 | Attending: Cardiology

## 2017-12-21 ENCOUNTER — Other Ambulatory Visit: Payer: Self-pay

## 2017-12-21 DIAGNOSIS — R011 Cardiac murmur, unspecified: Secondary | ICD-10-CM | POA: Diagnosis not present

## 2017-12-21 MED FILL — SYNTHROID 75 MCG TABLET: 75 | 90 days supply | Qty: 90 | Fill #0

## 2017-12-27 NOTE — Telephone Encounter (Signed)
Please advise Dr Panosh, thanks.   

## 2017-12-31 NOTE — Telephone Encounter (Signed)
Yes please  Arrange  tsh  Future  I believe the order is already in system  And  Handled

## 2018-01-14 ENCOUNTER — Other Ambulatory Visit: Payer: Self-pay | Admitting: Internal Medicine

## 2018-01-14 DIAGNOSIS — I77810 Thoracic aortic ectasia: Secondary | ICD-10-CM

## 2018-01-16 ENCOUNTER — Encounter: Payer: Self-pay | Admitting: Cardiovascular Disease

## 2018-01-16 ENCOUNTER — Ambulatory Visit (INDEPENDENT_AMBULATORY_CARE_PROVIDER_SITE_OTHER): Payer: Self-pay | Admitting: Cardiovascular Disease

## 2018-01-16 VITALS — BP 134/68 | HR 68 | Ht 62.0 in | Wt 200.0 lb

## 2018-01-16 DIAGNOSIS — I712 Thoracic aortic aneurysm, without rupture, unspecified: Secondary | ICD-10-CM | POA: Insufficient documentation

## 2018-01-16 DIAGNOSIS — I77810 Thoracic aortic ectasia: Secondary | ICD-10-CM

## 2018-01-16 NOTE — Assessment & Plan Note (Signed)
Denise Moran was referred to me by Dr. 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  Her thoracic aortic dimension was 40 mm.  I reassured her that this is relatively small and that we do not start wearing about intervention until with 50 to 55 mm.  May never get to that dimension during her lifetime.  I recommended repeat echo in 2 to 3 years to reevaluate.

## 2018-01-16 NOTE — Patient Instructions (Signed)
Medication Instructions:  NO CHANGES If you need a refill on your cardiac medications before your next appointment, please call your pharmacy.   Lab work: NONE If you have labs (blood work) drawn today and your tests are completely normal, you will receive your results only by: Marland Kitchen MyChart Message (if you have MyChart) OR . A paper copy in the mail If you have any lab test that is abnormal or we need to change your treatment, we will call you to review the results.  Testing/Procedures: NONE  Follow-Up: At [facility], you and your health needs are our priority.  As part of our continuing mission to provide you with exceptional heart care, we have created designated Provider Care Teams.  These Care Teams include your primary Cardiologist (physician) and Advanced Practice Providers (APPs -  Physician Assistants and Nurse Practitioners) who all work together to provide you with the care you need, when you need it. . You may schedule a follow up appointment as needed. You may see Dr. Allyson Sabal or one of the following Advanced Practice Providers on your designated Care Team:   . Corine Shelter, [place] . Azalee Course, PA-C . Micah Flesher, PA-C . Joni Reining, DNP . Theodore Demark, PA-C . Judy Pimple, PA-C . Marjie Skiff, PA-C

## 2018-01-16 NOTE — Progress Notes (Signed)
01/16/2018 MORRIS LEITAO   [birth year]  [id number]  Primary Physician Panosh, Neta Mends, MD Primary Cardiologist: Runell Gess MD Nicholes Calamity, [place]  HPI:  Denise Moran is a 78 y.o. moderately overweight married Caucasian female mother of 3, grandmother of 6 grandchildren referred to me by Dr. Fabian Sharp for evaluation of a mildly dilated thoracic aorta.  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  She is moving to the medical [place] within the next week with her husband.  She has no cardiac risk factors.  She is never had a heart attack or stroke.  She denies chest pain or shortness of breath.  There is a question of mitral valve prolapse.  Dr. Fabian Sharp apparently heard a murmur and got a 2D echo on 12/21/2017 that did not show mitral valve prolapse but did show a mildly dilated thoracic aorta at 40 mm.   Current Meds  Medication Sig  . Cholecalciferol (VITAMIN D-3) 25 MCG (1000 UT) CAPS Take 1,000 Units by mouth daily.  Marland Kitchen SYNTHROID 75 MCG tablet Take 1 tablet (75 mcg total) by mouth daily.  . VENTOLIN HFA 108 (90 Base) MCG/ACT inhaler Inhale 1-2 puffs into the lungs every 6 (six) hours as needed for wheezing or shortness of breath.     Allergies  Allergen Reactions  . Morphine   . Oxycodone-Acetaminophen   . Propoxyphene N-Acetaminophen     Social History   Socioeconomic History  . Marital status: Single    Spouse name: Not on file  . Number of children: Not on file  . Years of education: Not on file  . Highest education level: Not on file  Occupational History  . Not on file  Social Needs  . Financial resource strain: Not on file  . Food insecurity:    Worry: Not on file    Inability: Not on file  . Transportation needs:    Medical: Not on file    Non-medical: Not on file  Tobacco Use  . Smoking status: Former Smoker    Years: 24.00  . Smokeless tobacco: Never Used    Substance and Sexual Activity  . Alcohol use: Yes    Comment: social  . Drug use: No  . Sexual activity: Not on file  Lifestyle  . Physical activity:    Days per week: Not on file    Minutes per session: Not on file  . Stress: Not on file  Relationships  . Social connections:    Talks on phone: Not on file    Gets together: Not on file    Attends religious service: Not on file    Active member of club or organization: Not on file    Attends meetings of clubs or organizations: Not on file    Relationship status: Not on file  . Intimate partner violence:    Fear of current or ex partner: Not on file    Emotionally abused: Not on file    Physically abused: Not on file    Forced sexual activity: Not on file  Other Topics Concern  . 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  Cardiovascular: negative for chest pain, dyspnea on exertion, edema, orthopnea, palpitations, paroxysmal nocturnal dyspnea or shortness of breath Dermatological: negative for rash Respiratory: negative for cough or wheezing Urologic: negative for hematuria Abdominal: negative for nausea, vomiting, diarrhea, bright red blood per rectum, melena, or hematemesis Neurologic: negative for visual changes, syncope, or dizziness All other systems reviewed and are otherwise negative except as noted above.    Blood pressure 134/68, pulse 68, height 5\' 2"  (1.575 m), weight 200 lb (90.7 kg).  General appearance: alert and no distress Neck: no adenopathy, no carotid bruit, no JVD, supple, symmetrical, trachea midline and thyroid not enlarged, symmetric, no tenderness/mass/nodules Lungs: clear to auscultation  bilaterally Heart: regular rate and rhythm, S1, S2 normal, no murmur, click, rub or gallop Extremities: extremities normal, atraumatic, no cyanosis or edema Pulses: 2+ and symmetric Skin: Skin color, texture, turgor normal. No rashes or lesions Neurologic: Alert and oriented X 3, normal strength and tone. Normal symmetric reflexes. Normal coordination and gait  EKG sinus rhythm at 68 with isolated PVCs, right axis deviation and poor R wave progression.  I personally reviewed this EKG.  ASSESSMENT AND PLAN:   Thoracic aortic aneurysm [facility]) Ms. Kelter was referred to me by Dr. 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  Her thoracic aortic dimension was 40 mm.  I reassured her that this is relatively small and that we do not start wearing about intervention until with 50 to 55 mm.  May never get to that dimension during her lifetime.  I recommended repeat echo in 2 to 3 years to reevaluate.      Runell Gess MD FACP,FACC,FAHA, [facility] 01/16/2018 9:24 AM

## 2018-01-17 ENCOUNTER — Other Ambulatory Visit (INDEPENDENT_AMBULATORY_CARE_PROVIDER_SITE_OTHER): Payer: Self-pay

## 2018-01-17 DIAGNOSIS — E039 Hypothyroidism, unspecified: Secondary | ICD-10-CM

## 2018-01-17 LAB — TSH: TSH: 3.87 u[IU]/mL (ref 0.35–4.50)

## 2018-01-21 MED FILL — SYNTHROID 75 MCG TABLET: 75 | 90 days supply | Qty: 90 | Fill #1
